# Patient Record
Sex: Female | Born: 1970 | Race: White | Hispanic: No | Marital: Married | Smoking: Never smoker
Health system: Southern US, Community
[De-identification: ages and names within clinical notes are randomized; demographics above are authoritative.]

## PROBLEM LIST (undated history)

## (undated) DIAGNOSIS — J45909 Unspecified asthma, uncomplicated: Secondary | ICD-10-CM

## (undated) DIAGNOSIS — E785 Hyperlipidemia, unspecified: Secondary | ICD-10-CM

## (undated) DIAGNOSIS — F32A Depression, unspecified: Secondary | ICD-10-CM

## (undated) DIAGNOSIS — G4733 Obstructive sleep apnea (adult) (pediatric): Secondary | ICD-10-CM

## (undated) DIAGNOSIS — R011 Cardiac murmur, unspecified: Secondary | ICD-10-CM

## (undated) DIAGNOSIS — F419 Anxiety disorder, unspecified: Secondary | ICD-10-CM

## (undated) DIAGNOSIS — I10 Essential (primary) hypertension: Secondary | ICD-10-CM

## (undated) HISTORY — DX: Hyperlipidemia, unspecified: E78.5

## (undated) HISTORY — DX: Obstructive sleep apnea (adult) (pediatric): G47.33

## (undated) HISTORY — PX: OTHER SURGICAL HISTORY: SHX169

## (undated) HISTORY — PX: TONSILLECTOMY: SUR1361

## (undated) HISTORY — PX: APPENDECTOMY: SHX54

## (undated) HISTORY — PX: CHOLECYSTECTOMY: SHX55

## (undated) HISTORY — PX: OVARIAN CYST REMOVAL: SHX89

---

## 2017-12-18 DIAGNOSIS — E559 Vitamin D deficiency, unspecified: Secondary | ICD-10-CM | POA: Insufficient documentation

## 2017-12-18 DIAGNOSIS — D239 Other benign neoplasm of skin, unspecified: Secondary | ICD-10-CM | POA: Insufficient documentation

## 2017-12-18 DIAGNOSIS — R739 Hyperglycemia, unspecified: Secondary | ICD-10-CM | POA: Insufficient documentation

## 2020-03-04 ENCOUNTER — Ambulatory Visit (INDEPENDENT_AMBULATORY_CARE_PROVIDER_SITE_OTHER): Payer: BLUE CROSS/BLUE SHIELD

## 2020-03-04 ENCOUNTER — Other Ambulatory Visit: Payer: Self-pay

## 2020-03-04 ENCOUNTER — Ambulatory Visit
Admission: EM | Admit: 2020-03-04 | Discharge: 2020-03-04 | Disposition: A | Discharge status: Home / Self Care | Payer: BLUE CROSS/BLUE SHIELD | Attending: Emergency Medicine | Admitting: Emergency Medicine

## 2020-03-04 ENCOUNTER — Encounter: Payer: Self-pay | Admitting: Emergency Medicine

## 2020-03-04 DIAGNOSIS — R059 Cough, unspecified: Secondary | ICD-10-CM

## 2020-03-04 DIAGNOSIS — J019 Acute sinusitis, unspecified: Secondary | ICD-10-CM

## 2020-03-04 HISTORY — DX: Cardiac murmur, unspecified: R01.1

## 2020-03-04 HISTORY — DX: Anxiety disorder, unspecified: F41.9

## 2020-03-04 HISTORY — DX: Depression, unspecified: F32.A

## 2020-03-04 HISTORY — DX: Unspecified asthma, uncomplicated: J45.909

## 2020-03-04 MED ORDER — AMOXICILLIN-POT CLAVULANATE 875-125 MG PO TABS: 1.0000 | ORAL_TABLET | Freq: Two times a day (BID) | ORAL | 0 refills | Status: AC

## 2020-03-04 MED ORDER — BENZONATATE 100 MG PO CAPS: 100.0000 mg | ORAL_CAPSULE | Freq: Three times a day (TID) | ORAL | 0 refills | Status: DC

## 2020-03-04 NOTE — Discharge Instructions (Signed)
Chest x-ray negative for cardiopulmonary disease Get plenty of rest and push fluids Tessalon Perles prescribed for cough Augmentin for possible sinus infection Use OTC zyrtec for nasal congestion, runny nose, and/or sore throat Use OTC flonase for nasal congestion and runny nose Use medications daily for symptom relief Use OTC medications like ibuprofen or tylenol as needed fever or pain Call or go to the ED if you have any new or worsening symptoms such as fever, worsening cough, shortness of breath, chest tightness, chest pain, turning blue, changes in mental status, etc..Marland Kitchen

## 2020-03-04 NOTE — ED Triage Notes (Signed)
Cough x 2 weeks, has had several neg covid test in the last 2 weeks.

## 2020-03-04 NOTE — ED Provider Notes (Signed)
[facility]   [id number] 03/04/20 Arrival Time: 1129   CC: Cough  SUBJECTIVE: History from: patient.  Darlene Christian is a 49 y.o. female who presents with mild sinus pain/ pressure, and dry/ productive cough x 2 weeks.  Treats patients with atypical PNA.  Has tried OTC cough drops with minimal relief.  Symptoms are made worse with deep breath.  Reports previous symptoms in the past with PNA.  Had COVID in September 2020.   Denies fever, chills, SOB, wheezing, chest pain, nausea, changes in bowel or bladder habits.    ROS: As per HPI.  All other pertinent ROS negative.     Past Medical History:  Diagnosis Date  . Anxiety   . Asthma   . Depression   . Heart murmur    Past Surgical History:  Procedure Laterality Date  . APPENDECTOMY    . CHOLECYSTECTOMY    . OVARIAN CYST REMOVAL    . TONSILLECTOMY     Allergies  Allergen Reactions  . Azithromycin Hives  . Capsaicin   . Lasix [Furosemide] Hives  . Sulfa Antibiotics Hives   No current facility-administered medications on file prior to encounter.   No current outpatient medications on file prior to encounter.   Social History   Socioeconomic History  . Marital status: Married    Spouse name: Not on file  . Number of children: Not on file  . Years of education: Not on file  . Highest education level: Not on file  Occupational History  . Not on file  Tobacco Use  . Smoking status: Never Smoker  . Smokeless tobacco: Never Used  Substance and Sexual Activity  . Alcohol use: Yes    Comment: occ  . Drug use: Never  . Sexual activity: Not on file  Other Topics Concern  . Not on file  Social History Narrative  . Not on file   Social Determinants of Health   Financial Resource Strain:   . Difficulty of Paying Living Expenses: Not on file  Food Insecurity:   . Worried About Charity fundraiser in the Last Year: Not on file  . Ran Out of Food in the Last Year: Not on file  Transportation Needs:   .  Lack of Transportation (Medical): Not on file  . Lack of Transportation (Non-Medical): Not on file  Physical Activity:   . Days of Exercise per Week: Not on file  . Minutes of Exercise per Session: Not on file  Stress:   . Feeling of Stress : Not on file  Social Connections:   . Frequency of Communication with Friends and Family: Not on file  . Frequency of Social Gatherings with Friends and Family: Not on file  . Attends Religious Services: Not on file  . Active Member of Clubs or Organizations: Not on file  . Attends Archivist Meetings: Not on file  . Marital Status: Not on file  Intimate Partner Violence:   . Fear of Current or Ex-Partner: Not on file  . Emotionally Abused: Not on file  . Physically Abused: Not on file  . Sexually Abused: Not on file   No family history on file.  OBJECTIVE:  Vitals:   03/04/20 1145 03/04/20 1147  BP:  (!) 162/86  Pulse: 87   Resp: 19   Temp: 98.7 F (37.1 C)   TempSrc: Oral   SpO2: 96%   Weight:  248 lb (112.5 kg)  Height:  5\' 3"  (1.6 m)  General appearance: alert; appears mildly fatigued, but nontoxic; speaking in full sentences and tolerating own secretions HEENT: NCAT; Ears: EACs clear, TMs pearly gray; Eyes: PERRL.  EOM grossly intact. Sinuses: TTP; Nose: nares patent without rhinorrhea, Throat: oropharynx clear, tonsils non erythematous or enlarged, uvula midline  Neck: supple without LAD Lungs: unlabored respirations, symmetrical air entry; cough: mild; no respiratory distress; CTAB Heart: regular rate and rhythm.  Skin: warm and dry Psychological: alert and cooperative; normal mood and affect  DIAGNOSTIC STUDIES:  DG Chest 2 View  Result Date: 03/04/2020 CLINICAL DATA:  Cough. EXAM: CHEST - 2 VIEW COMPARISON:  None. FINDINGS: The heart size and mediastinal contours are within normal limits. Both lungs are clear. The visualized skeletal structures are unremarkable. IMPRESSION: No active cardiopulmonary disease.  Electronically Signed   By: Marijo Conception M.D.   On: 03/04/2020 12:22    X-rays negative for cardiopulmonary disease  I have reviewed the x-rays myself and the radiologist interpretation. I am in agreement with the radiologist interpretation.     ASSESSMENT & PLAN:  1. Cough   2. Acute non-recurrent sinusitis, unspecified location     Meds ordered this encounter  Medications  . amoxicillin-clavulanate (AUGMENTIN) 875-125 MG tablet    Sig: Take 1 tablet by mouth every 12 (twelve) hours for 10 days.    Dispense:  20 tablet    Refill:  0    Order Specific Question:   Supervising Provider    Answer:   Raylene Everts [[id number]]  . benzonatate (TESSALON) 100 MG capsule    Sig: Take 1 capsule (100 mg total) by mouth every 8 (eight) hours.    Dispense:  21 capsule    Refill:  0    Order Specific Question:   Supervising Provider    Answer:   Raylene Everts [[id number]]   Chest x-ray negative for cardiopulmonary disease Get plenty of rest and push fluids Tessalon Perles prescribed for cough Augmentin for possible sinus infection Use OTC zyrtec for nasal congestion, runny nose, and/or sore throat Use OTC flonase for nasal congestion and runny nose Use medications daily for symptom relief Use OTC medications like ibuprofen or tylenol as needed fever or pain Call or go to the ED if you have any new or worsening symptoms such as fever, worsening cough, shortness of breath, chest tightness, chest pain, turning blue, changes in mental status, etc...   Reviewed expectations re: course of current medical issues. Questions answered. Outlined signs and symptoms indicating need for more acute intervention. Patient verbalized understanding. After Visit Summary given.         Lestine Box, PA-C 03/04/20 1232

## 2021-08-14 IMAGING — DX DG CHEST 2V
2 series · 2 of 2 positions shown · non-contrast
Comparison: None.

CLINICAL DATA: Cough.

EXAM:
CHEST - 2 VIEW

[chest pa]
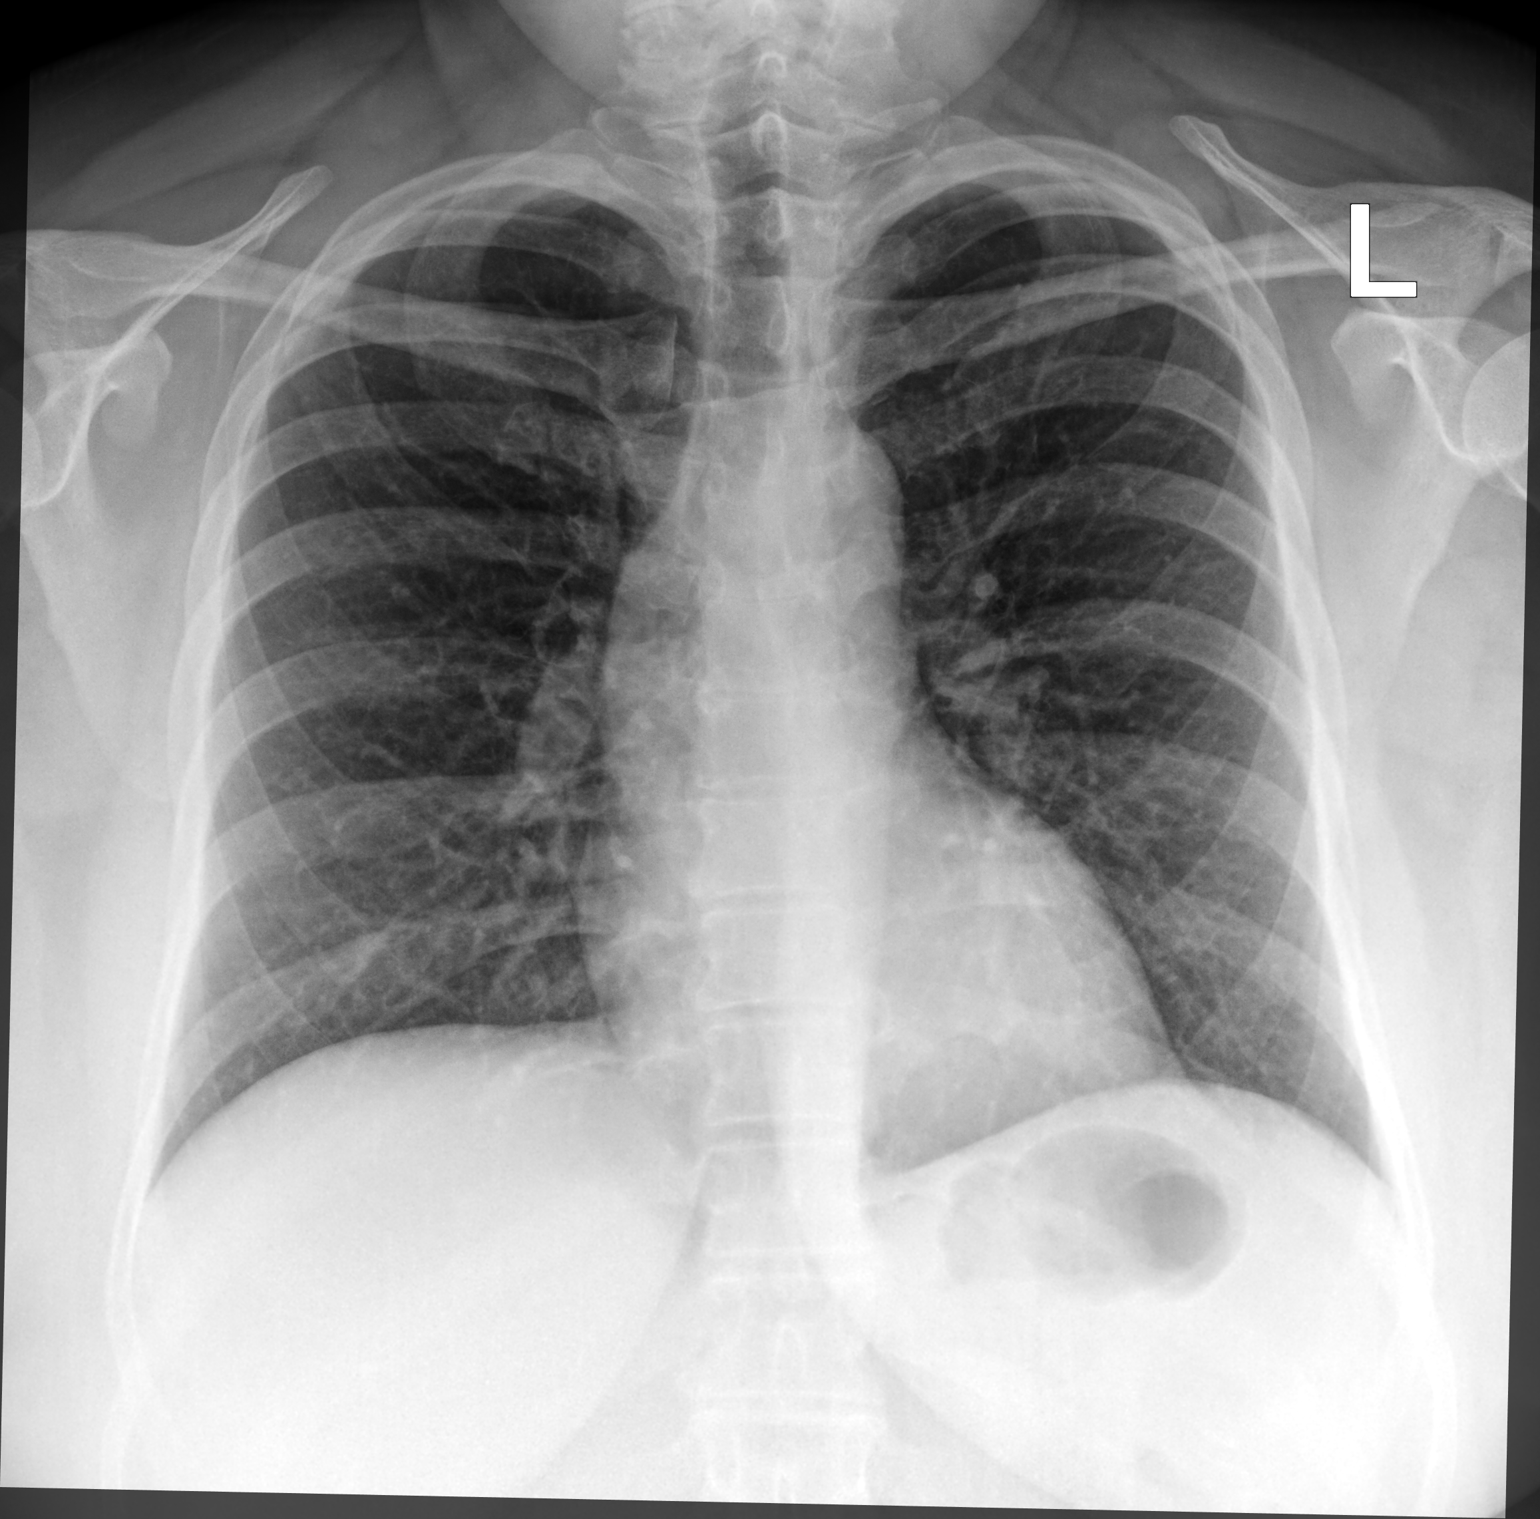

[chest lat]
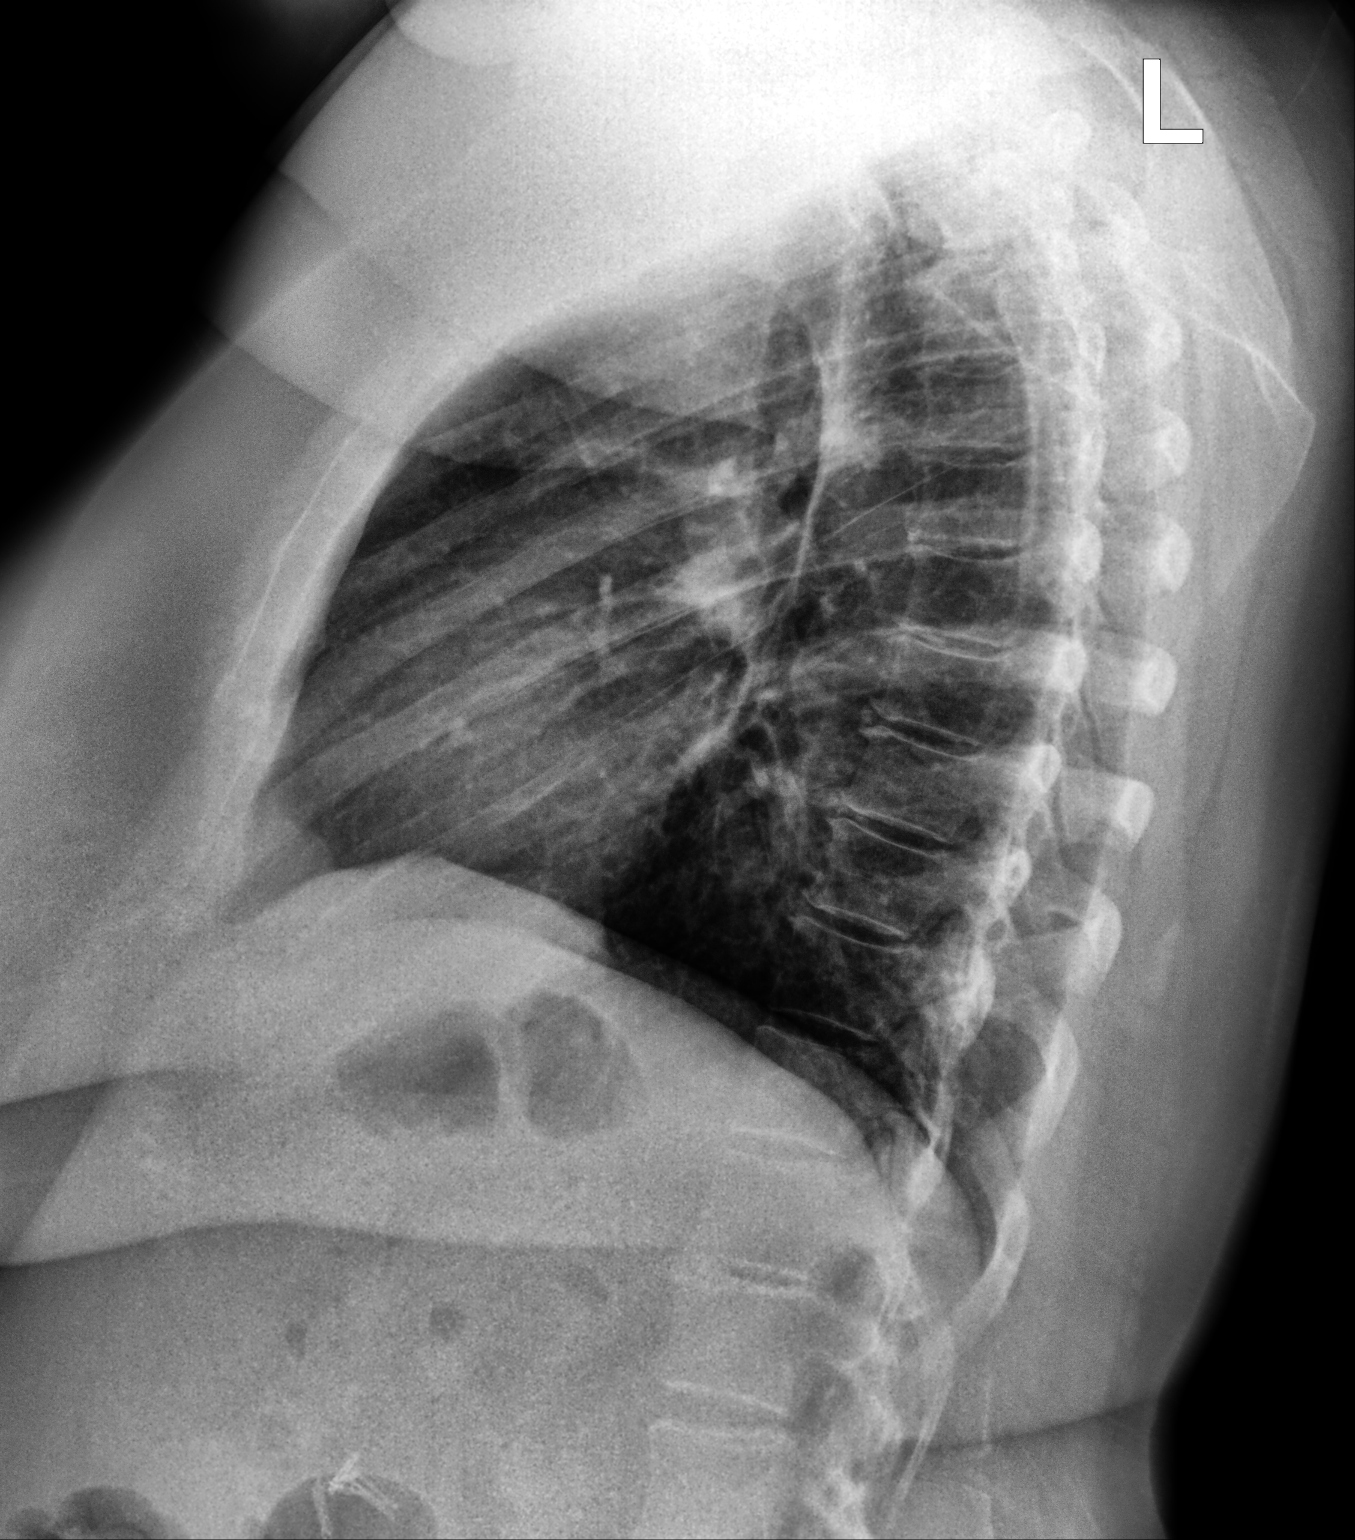

[2 of 2 positions shown; findings below may reference images not displayed]

FINDINGS: The heart size and mediastinal contours are within normal limits.
Both lungs are clear. The visualized skeletal structures are
unremarkable.
IMPRESSION: No active cardiopulmonary disease.

## 2021-11-03 ENCOUNTER — Ambulatory Visit
Admission: EM | Admit: 2021-11-03 | Discharge: 2021-11-03 | Disposition: A | Discharge status: Home / Self Care | Payer: BLUE CROSS/BLUE SHIELD

## 2021-11-03 ENCOUNTER — Encounter: Payer: Self-pay | Admitting: Emergency Medicine

## 2021-11-03 DIAGNOSIS — J209 Acute bronchitis, unspecified: Secondary | ICD-10-CM | POA: Diagnosis not present

## 2021-11-03 DIAGNOSIS — L03012 Cellulitis of left finger: Secondary | ICD-10-CM

## 2021-11-03 DIAGNOSIS — J4521 Mild intermittent asthma with (acute) exacerbation: Secondary | ICD-10-CM

## 2021-11-03 DIAGNOSIS — R030 Elevated blood-pressure reading, without diagnosis of hypertension: Secondary | ICD-10-CM | POA: Diagnosis not present

## 2021-11-03 HISTORY — DX: Essential (primary) hypertension: I10

## 2021-11-03 MED ORDER — PREDNISONE 20 MG PO TABS: 40.0000 mg | ORAL_TABLET | Freq: Every day | ORAL | 0 refills | Status: DC

## 2021-11-03 MED ORDER — ALBUTEROL SULFATE HFA 108 (90 BASE) MCG/ACT IN AERS: 1.0000 | INHALATION_SPRAY | Freq: Four times a day (QID) | RESPIRATORY_TRACT | 0 refills | Status: DC | PRN

## 2021-11-03 MED ORDER — CEPHALEXIN 500 MG PO CAPS: 500.0000 mg | ORAL_CAPSULE | Freq: Two times a day (BID) | ORAL | 0 refills | Status: DC

## 2021-11-03 MED ORDER — HYDROCHLOROTHIAZIDE 25 MG PO TABS: 25.0000 mg | ORAL_TABLET | Freq: Every day | ORAL | 1 refills | Status: DC

## 2021-11-03 NOTE — ED Provider Notes (Signed)
[facility]    CSN: [id number] Arrival date & time: 11/03/21  1131      History   Chief Complaint No chief complaint on file.   HPI Darlene Christian is a 51 y.o. female.   Presenting today with multiple concerns.  States that she has had cough, chest tightness, wheezing, mild shortness of breath for the past 2.5 weeks.  Denies fever, chills, congestion, sore throat.  Took a COVID test toward onset of symptoms that was negative.  History of asthma and states she has had off-and-on flareups such as this since having COVID in the past.  Ran out of her albuterol inhaler recently.  Recently had a hangnail on the left ring finger and notes some erythema, pain in the area the past few days that is worsening.  She is also out of her hydrochlorothiazide that she takes for her lower leg edema and notes her blood pressure has been elevated since being off of it.  Has been eating a lot of fast food and working a high stress job, thinks this is contributing.  Denies chest pain, shortness of breath, headaches, dizziness.    Past Medical History:  Diagnosis Date   Anxiety    Asthma    Depression    Heart murmur    Hypertension     There are no problems to display for this patient.   Past Surgical History:  Procedure Laterality Date   APPENDECTOMY     CHOLECYSTECTOMY     OVARIAN CYST REMOVAL     TONSILLECTOMY      OB History   No obstetric history on file.      Home Medications    Prior to Admission medications   Medication Sig Start Date End Date Taking? Authorizing Provider  albuterol (VENTOLIN HFA) 108 (90 Base) MCG/ACT inhaler Inhale 1-2 puffs into the lungs every 6 (six) hours as needed for wheezing or shortness of breath. 11/03/21  Yes Volney American, PA-C  cephALEXin (KEFLEX) 500 MG capsule Take 1 capsule (500 mg total) by mouth 2 (two) times daily. 11/03/21  Yes Volney American, PA-C  predniSONE (DELTASONE) 20 MG tablet Take 2 tablets (40 mg total)  by mouth daily with breakfast. 11/03/21  Yes Volney American, PA-C  benzonatate (TESSALON) 100 MG capsule Take 1 capsule (100 mg total) by mouth every 8 (eight) hours. 03/04/20   [place], [place], PA-C  hydrochlorothiazide (HYDRODIURIL) 25 MG tablet Take 1 tablet (25 mg total) by mouth daily. 11/03/21   Volney American, PA-C    Family History History reviewed. No pertinent family history.  Social History Social History   Tobacco Use   Smoking status: Never   Smokeless tobacco: Never  Substance Use Topics   Alcohol use: Yes    Comment: occ   Drug use: Never   Allergies   Azithromycin, Capsaicin, Lasix [furosemide], and Sulfa antibiotics  Review of Systems Review of Systems Per HPI  Physical Exam Triage Vital Signs ED Triage Vitals  Enc Vitals Group     BP 11/03/21 1140 (!) 172/110     Pulse Rate 11/03/21 1140 78     Resp 11/03/21 1140 18     Temp 11/03/21 1140 98.2 F (36.8 C)     Temp Source 11/03/21 1140 Oral     SpO2 11/03/21 1140 98 %     Weight --      Height --      Head Circumference --      Peak  Flow --      Pain Score 11/03/21 1141 0     Pain Loc --      Pain Edu? --      Excl. in [place]? --    No data found.  Updated Vital Signs BP (!) 172/110 (BP Location: Right Arm)   Pulse 78   Temp 98.2 F (36.8 C) (Oral)   Resp 18   LMP 10/10/2021 (Approximate)   SpO2 98%   Visual Acuity Right Eye Distance:   Left Eye Distance:   Bilateral Distance:    Right Eye Near:   Left Eye Near:    Bilateral Near:     Physical Exam Vitals and nursing note reviewed.  Constitutional:      Appearance: Normal appearance. She is not ill-appearing.  HENT:     Head: Atraumatic.     Mouth/Throat:     Mouth: Mucous membranes are moist.  Eyes:     Extraocular Movements: Extraocular movements intact.     Conjunctiva/sclera: Conjunctivae normal.  Cardiovascular:     Rate and Rhythm: Normal rate and regular rhythm.     Heart sounds: Normal heart sounds.   Pulmonary:     Effort: Pulmonary effort is normal.     Breath sounds: Wheezing present.     Comments: Trace wheezes bilaterally Musculoskeletal:        General: Normal range of motion.     Cervical back: Normal range of motion and neck supple.  Skin:    General: Skin is warm and dry.     Findings: Erythema present.     Comments: Medial edge of the left ring finger nail erythematous, mildly edematous.  No abscess formation  Neurological:     Mental Status: She is alert and oriented to person, place, and time.     Motor: No weakness.     Gait: Gait normal.     Comments: Left hand neurovascularly intact  Psychiatric:        Mood and Affect: Mood normal.        Thought Content: Thought content normal.        Judgment: Judgment normal.      UC Treatments / Results  Labs (all labs ordered are listed, but only abnormal results are displayed) Labs Reviewed - No data to display  EKG   Radiology No results found.  Procedures Procedures (including critical care time)  Medications Ordered in UC Medications - No data to display  Initial Impression / Assessment and Plan / UC Course  I have reviewed the triage vital signs and the nursing notes.  Pertinent labs & imaging results that were available during my care of the patient were reviewed by me and considered in my medical decision making (see chart for details).     Elevated blood pressure reading today, otherwise vital signs reassuring.  Will restart hydrochlorothiazide and monitor for benefit.  Discussed exercise, DASH diet as well.  Close PCP follow-up for recheck.  Will start Keflex for early cellulitis of the left finger, discussed home wound care.  Prednisone, albuterol inhaler for asthma exacerbation/bronchitis.  Return for worsening symptoms.  Final Clinical Impressions(s) / UC Diagnoses   Final diagnoses:  Acute bronchitis, unspecified organism  Mild intermittent asthma with acute exacerbation  Cellulitis of  finger of left hand  Elevated blood pressure reading   Discharge Instructions   None    ED Prescriptions     Medication Sig Dispense Auth. Provider   hydrochlorothiazide (HYDRODIURIL) 25 MG tablet Take  1 tablet (25 mg total) by mouth daily. 30 tablet Volney American, [place]   albuterol (VENTOLIN HFA) 108 (90 Base) MCG/ACT inhaler Inhale 1-2 puffs into the lungs every 6 (six) hours as needed for wheezing or shortness of breath. 18 g Volney American, [place]   predniSONE (DELTASONE) 20 MG tablet Take 2 tablets (40 mg total) by mouth daily with breakfast. 10 tablet Volney American, PA-C   cephALEXin (KEFLEX) 500 MG capsule Take 1 capsule (500 mg total) by mouth 2 (two) times daily. 14 capsule Volney American, [place]      PDMP not reviewed this encounter.   Volney American, [place] 11/03/21 1257

## 2021-11-03 NOTE — ED Triage Notes (Signed)
Cough, history of asthma feels SOB x 2.5 weeks.  Covid test was negative. States she thinks her left ring finger is infected around nail.  States she is out of her hydrochlorothiazide and her albuterol inhaler

## 2021-11-25 ENCOUNTER — Other Ambulatory Visit: Payer: Self-pay | Admitting: Family Medicine

## 2021-11-27 NOTE — Telephone Encounter (Signed)
prescribed by Merrie Roof PA at Urgent Care 11/03/21  Requested Prescriptions  Refused Prescriptions Disp Refills  . hydrochlorothiazide (HYDRODIURIL) 25 MG tablet [Pharmacy Med Name: HYDROCHLOROTHIAZIDE 25 MG TAB] 30 tablet 1    Sig: TAKE 1 TABLET (25 MG TOTAL) BY MOUTH DAILY.     There is no refill protocol information for this order

## 2022-03-11 ENCOUNTER — Other Ambulatory Visit: Payer: Self-pay | Admitting: Family Medicine

## 2022-03-12 NOTE — Telephone Encounter (Signed)
Urgent Care patient Requested Prescriptions  Pending Prescriptions Disp Refills   albuterol (VENTOLIN HFA) 108 (90 Base) MCG/ACT inhaler [Pharmacy Med Name: ALBUTEROL HFA (PROAIR) INHALER] 8.5 each     Sig: INHALE 1-2 PUFFS BY MOUTH EVERY 6 HOURS AS NEEDED FOR WHEEZE OR SHORTNESS OF BREATH     There is no refill protocol information for this order

## 2022-06-01 ENCOUNTER — Ambulatory Visit: Payer: BLUE CROSS/BLUE SHIELD | Admitting: Family Medicine

## 2022-06-01 ENCOUNTER — Encounter: Payer: Self-pay | Admitting: Family Medicine

## 2022-06-01 VITALS — BP 147/80 | HR 79 | Temp 97.9°F | Ht 63.0 in | Wt 251.2 lb

## 2022-06-01 DIAGNOSIS — R7302 Impaired glucose tolerance (oral): Secondary | ICD-10-CM

## 2022-06-01 DIAGNOSIS — Z7689 Persons encountering health services in other specified circumstances: Secondary | ICD-10-CM

## 2022-06-01 DIAGNOSIS — R2231 Localized swelling, mass and lump, right upper limb: Secondary | ICD-10-CM

## 2022-06-01 DIAGNOSIS — J4520 Mild intermittent asthma, uncomplicated: Secondary | ICD-10-CM | POA: Diagnosis not present

## 2022-06-01 DIAGNOSIS — R0683 Snoring: Secondary | ICD-10-CM | POA: Diagnosis not present

## 2022-06-01 DIAGNOSIS — I10 Essential (primary) hypertension: Secondary | ICD-10-CM | POA: Diagnosis not present

## 2022-06-01 DIAGNOSIS — N761 Subacute and chronic vaginitis: Secondary | ICD-10-CM

## 2022-06-01 LAB — POCT GLYCOSYLATED HEMOGLOBIN (HGB A1C): Hemoglobin A1C: 5.8 % — AB (ref 4.0–5.6)

## 2022-06-01 MED ORDER — POTASSIUM CHLORIDE CRYS ER 20 MEQ PO TBCR: 20.0000 meq | EXTENDED_RELEASE_TABLET | Freq: Every day | ORAL | 1 refills | Status: DC

## 2022-06-01 MED ORDER — NYSTATIN 100000 UNIT/GM EX OINT: 1.0000 | TOPICAL_OINTMENT | Freq: Two times a day (BID) | CUTANEOUS | 0 refills | Status: DC

## 2022-06-01 MED ORDER — COMBIVENT RESPIMAT 20-100 MCG/ACT IN AERS: 1.0000 | INHALATION_SPRAY | Freq: Four times a day (QID) | RESPIRATORY_TRACT | 5 refills | Status: AC | PRN

## 2022-06-01 MED ORDER — HYDROCHLOROTHIAZIDE 25 MG PO TABS: 25.0000 mg | ORAL_TABLET | Freq: Every day | ORAL | 1 refills | Status: DC

## 2022-06-01 NOTE — Progress Notes (Signed)
New Patient Office Visit  Subjective    Patient ID: Darlene Christian, female    DOB: 1970-09-01  Age: 52 y.o. MRN: [id number]  CC:  Chief Complaint  Patient presents with   New Patient (Initial Visit)    Patient in office - c/o recently elevated BP - requesting be checked for DM - feels different with eating sugar -     HPI Darlene Christian presents to establish care  Pt has hx of prediabetes. She would like her A1c checked today. She reports after she eats certain things, she felt like she had dizziness. She reports this has been going on for the last month.   She reports her blood pressure has been elevated. She went to urgent care in July 2023 for acute bronchitis. She says she had the HCTZ a few times a week for fluid. She says if she takes it daily, she has cramps.   Pt has hx of  HLD and was on Crestor. Ran out of this.  She has asthma and using Combivent and Flonase.   Pt reports her husband says she snores almost nightly. She would like sleep study.  She has vaginal itching. Worse at night. She has had to use a pad due to urinary leakage. She says she has used this in the last year. She also has had the itching in the last year.   She has periods of right finger nodules in the last week. She has used aleve prn for the pain and has helped. She was worried about RA.   Outpatient Encounter Medications as of 06/01/2022  Medication Sig   fluticasone (FLONASE) 50 MCG/ACT nasal spray Place 1 spray into both nostrils daily.   nystatin ointment (MYCOSTATIN) Apply 1 Application topically 2 (two) times daily.   potassium chloride SA (KLOR-CON M) 20 MEQ tablet Take 1 tablet (20 mEq total) by mouth daily.   [DISCONTINUED] hydrochlorothiazide (HYDRODIURIL) 25 MG tablet Take 1 tablet (25 mg total) by mouth daily.   [DISCONTINUED] Ipratropium-Albuterol (COMBIVENT RESPIMAT) 20-100 MCG/ACT AERS respimat Inhale 1 puff into the lungs every 6 (six) hours as needed.   [DISCONTINUED] rosuvastatin  (CRESTOR) 5 MG tablet Take 5 mg by mouth daily.   hydrochlorothiazide (HYDRODIURIL) 25 MG tablet Take 1 tablet (25 mg total) by mouth daily.   Ipratropium-Albuterol (COMBIVENT RESPIMAT) 20-100 MCG/ACT AERS respimat Inhale 1 puff into the lungs every 6 (six) hours as needed.   [DISCONTINUED] albuterol (VENTOLIN HFA) 108 (90 Base) MCG/ACT inhaler Inhale 1-2 puffs into the lungs every 6 (six) hours as needed for wheezing or shortness of breath.   [DISCONTINUED] benzonatate (TESSALON) 100 MG capsule Take 1 capsule (100 mg total) by mouth every 8 (eight) hours.   [DISCONTINUED] cephALEXin (KEFLEX) 500 MG capsule Take 1 capsule (500 mg total) by mouth 2 (two) times daily.   [DISCONTINUED] predniSONE (DELTASONE) 20 MG tablet Take 2 tablets (40 mg total) by mouth daily with breakfast.   No facility-administered encounter medications on file as of 06/01/2022.    Past Medical History:  Diagnosis Date   Anxiety    Asthma    Depression    Heart murmur    HLD (hyperlipidemia)    Hypertension     Past Surgical History:  Procedure Laterality Date   APPENDECTOMY     CHOLECYSTECTOMY     laproscopy     TONSILLECTOMY      Family History  Problem Relation Age of Onset   Hypertension Mother    Aneurysm Mother  brain   Hypertension Father    Breast cancer Maternal Grandmother    Uterine cancer Maternal Grandmother    Liver cancer Maternal Grandmother    Diabetes Maternal Grandmother    Throat cancer Maternal Grandfather    Aneurysm Maternal Grandfather    Hypertension Maternal Grandfather    Breast cancer Paternal Grandmother    Melanoma Paternal Grandmother    Hypertension Paternal Grandmother    Diabetes Paternal Grandfather    Colon cancer Paternal Grandfather     Social History   Socioeconomic History   Marital status: Married    Spouse name: jeffrey   Number of children: 2   Years of education: Not on file   Highest education level: Not on file  Occupational History    Occupation: Designer, 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  Tobacco Use   Smoking status: Never   Smokeless tobacco: Never  Vaping Use   Vaping Use: Never used  Substance and Sexual Activity   Alcohol use: Yes    Comment: occ   Drug use: Never   Sexual activity: Yes    Birth control/protection: None  Other Topics Concern   Not on file  Social History Narrative   Not on file   Social Determinants of Health   Financial Resource Strain: Not on file  Food Insecurity: Not on file  Transportation Needs: Not on file  Physical Activity: Not on file  Stress: Not on file  Social Connections: Not on file  Intimate Partner Violence: Not on file    Review of Systems  Musculoskeletal:  Positive for joint pain.  Skin:  Positive for itching.       Vaginal itching  Neurological:        Dizziness after eating  Psychiatric/Behavioral:  The patient has insomnia.        Snoring  All other systems reviewed and are negative.       Objective    BP (!) 147/80   Pulse 79   Temp 97.9 F (36.6 C)   Ht 5' 3"$  (1.6 m)   Wt 251 lb 4 oz (114 kg)   SpO2 99%   BMI 44.51 kg/m   Physical Exam Vitals and nursing note reviewed.  Constitutional:      Appearance: Normal appearance. She is obese.  HENT:     Head: Normocephalic and atraumatic.     Right Ear: Tympanic membrane, ear canal and external ear normal.     Left Ear: Tympanic membrane, ear canal and external ear normal.     Nose: Nose normal.     Mouth/Throat:     Mouth: Mucous membranes are moist.  Eyes:     Extraocular Movements: Extraocular movements intact.     Conjunctiva/sclera: Conjunctivae normal.     Pupils: Pupils are equal, round, and reactive to light.  Cardiovascular:     Rate and Rhythm: Normal rate and regular rhythm.     Pulses: Normal pulses.     Heart sounds: Normal heart sounds.  Pulmonary:     Breath sounds: Normal breath sounds.  Musculoskeletal:        General: Normal range of motion.     Comments: Nodules to right  1st and 2nd DIP joints  Skin:    General: Skin is warm.     Capillary Refill: Capillary refill takes less than 2 seconds.  Neurological:     General: No focal deficit present.     Mental Status: She is alert and oriented to person, place, and time. Mental  status is at baseline.  Psychiatric:        Mood and Affect: Mood normal.        Behavior: Behavior normal.        Thought Content: Thought content normal.        Judgment: Judgment normal.         Assessment & Plan:   Problem List Items Addressed This Visit   None Visit Diagnoses     Encounter to establish care with new doctor    -  Primary   Impaired glucose tolerance       Relevant Orders   POCT glycosylated hemoglobin (Hb A1C) (Completed)   Primary hypertension       Relevant Medications   hydrochlorothiazide (HYDRODIURIL) 25 MG tablet   potassium chloride SA (KLOR-CON M) 20 MEQ tablet   Mild intermittent asthma without complication       Relevant Medications   Ipratropium-Albuterol (COMBIVENT RESPIMAT) 20-100 MCG/ACT AERS respimat   Snoring       Relevant Orders   Ambulatory referral to Sleep Studies   Chronic vaginitis       Relevant Medications   nystatin ointment (MYCOSTATIN)   Nodule of finger of right hand          Lab Results  Component Value Date   HGBA1C 5.8 (A) 06/01/2022   Prediabetes much better. Continue to monitor. Diet and exercise Refill HCTZ 62m to take daily with KCL 20 meq. Monitor blood pressures outside the office and follow up for annual Refilled Combivent for asthma Refer for sleep study Nystatin ointment and try to change urinary pads.  Continue Aleve prn for joint pain. 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   Return in about 6 weeks (around 07/13/2022) for Annual Physical.   ALeeanne Rio MD

## 2022-06-24 ENCOUNTER — Encounter: Payer: Self-pay | Admitting: Family Medicine

## 2022-07-13 ENCOUNTER — Encounter: Payer: BLUE CROSS/BLUE SHIELD | Admitting: Family Medicine

## 2022-07-20 ENCOUNTER — Encounter: Payer: BLUE CROSS/BLUE SHIELD | Admitting: Family Medicine

## 2022-08-03 ENCOUNTER — Ambulatory Visit (INDEPENDENT_AMBULATORY_CARE_PROVIDER_SITE_OTHER): Payer: BLUE CROSS/BLUE SHIELD | Admitting: Family Medicine

## 2022-08-03 ENCOUNTER — Encounter: Payer: Self-pay | Admitting: Family Medicine

## 2022-08-03 VITALS — BP 124/83 | HR 75 | Temp 98.1°F | Resp 18 | Ht 63.0 in | Wt 250.2 lb

## 2022-08-03 DIAGNOSIS — Z0000 Encounter for general adult medical examination without abnormal findings: Secondary | ICD-10-CM

## 2022-08-03 DIAGNOSIS — F99 Mental disorder, not otherwise specified: Secondary | ICD-10-CM

## 2022-08-03 DIAGNOSIS — F5105 Insomnia due to other mental disorder: Secondary | ICD-10-CM

## 2022-08-03 DIAGNOSIS — R7302 Impaired glucose tolerance (oral): Secondary | ICD-10-CM | POA: Diagnosis not present

## 2022-08-03 DIAGNOSIS — Z1211 Encounter for screening for malignant neoplasm of colon: Secondary | ICD-10-CM

## 2022-08-03 DIAGNOSIS — R5382 Chronic fatigue, unspecified: Secondary | ICD-10-CM

## 2022-08-03 DIAGNOSIS — Z1231 Encounter for screening mammogram for malignant neoplasm of breast: Secondary | ICD-10-CM

## 2022-08-03 DIAGNOSIS — Z1159 Encounter for screening for other viral diseases: Secondary | ICD-10-CM

## 2022-08-03 DIAGNOSIS — Z13220 Encounter for screening for lipoid disorders: Secondary | ICD-10-CM

## 2022-08-03 DIAGNOSIS — Z6841 Body Mass Index (BMI) 40.0 and over, adult: Secondary | ICD-10-CM

## 2022-08-03 MED ORDER — ESCITALOPRAM OXALATE 10 MG PO TABS: 10.0000 mg | ORAL_TABLET | Freq: Every day | ORAL | 1 refills | Status: DC

## 2022-08-03 NOTE — Progress Notes (Signed)
Complete physical exam  Patient: Darlene Christian   DOB: 04-04-1971   51 y.o. Female  MRN: [id number]  Subjective:    Chief Complaint  Patient presents with   Annual Exam    Patient would like to talk about weight loss    Darlene Christian is a 52 y.o. female who presents today for a complete physical exam. She reports consuming a general diet. The patient does not participate in regular exercise at present. She generally feels well. She reports sleeping fairly well. She does not have additional problems to discuss today.  Fasting today. Pt is interested on weight loss medicine. Last A1c 5.8 in Feb 2024.  Most recent fall risk assessment:    06/01/2022   10:40 AM  Fall Risk   Falls in the past year? 1  Number falls in past yr: 1  Injury with Fall? 0  Risk for fall due to : History of fall(s)  Follow up Falls evaluation completed     Most recent depression screenings:    06/01/2022   10:40 AM  PHQ 2/9 Scores  PHQ - 2 Score 2  PHQ- 9 Score 13    Vision:Within last year  Patient Active Problem List   Diagnosis Date Noted   Dysplastic nevus of skin 12/18/2017   Hyperglycemia 12/18/2017   Vitamin D deficiency 12/18/2017   Past Medical History:  Diagnosis Date   Anxiety    Asthma    Depression    Heart murmur    HLD (hyperlipidemia)    Hypertension    Past Surgical History:  Procedure Laterality Date   APPENDECTOMY     CHOLECYSTECTOMY     laproscopy     TONSILLECTOMY     Social History   Tobacco Use   Smoking status: Never   Smokeless tobacco: Never  Vaping Use   Vaping Use: Never used  Substance Use Topics   Alcohol use: Yes    Comment: occ   Drug use: Never      Patient Care Team: Suzan Slick, MD as PCP - General (Family Medicine)   Outpatient Medications Prior to Visit  Medication Sig   fluticasone (FLONASE) 50 MCG/ACT nasal spray Place 1 spray into both nostrils daily.   hydrochlorothiazide (HYDRODIURIL) 25 MG tablet Take 1 tablet (25 mg  total) by mouth daily.   Ipratropium-Albuterol (COMBIVENT RESPIMAT) 20-100 MCG/ACT AERS respimat Inhale 1 puff into the lungs every 6 (six) hours as needed.   potassium chloride SA (KLOR-CON M) 20 MEQ tablet Take 1 tablet (20 mEq total) by mouth daily.   nystatin ointment (MYCOSTATIN) Apply 1 Application topically 2 (two) times daily. (Patient not taking: Reported on 08/03/2022)   No facility-administered medications prior to visit.    Review of Systems  Constitutional:  Positive for malaise/fatigue.  Cardiovascular:  Negative for leg swelling and PND.  Psychiatric/Behavioral:  The patient is nervous/anxious.   All other systems reviewed and are negative.         Objective:     BP 124/83   Pulse 75   Temp 98.1 F (36.7 C) (Oral)   Resp 18   Ht 5\' 3"  (1.6 m)   Wt 250 lb 3.2 oz (113.5 kg)   SpO2 97%   BMI 44.32 kg/m  BP Readings from Last 3 Encounters:  08/03/22 124/83  06/01/22 (!) 147/80  11/03/21 (!) 172/110      Physical Exam Vitals and nursing note reviewed.  Constitutional:      Appearance: Normal appearance.  She is obese.  HENT:     Head: Normocephalic and atraumatic.     Right Ear: External ear normal.     Left Ear: External ear normal.     Nose: Nose normal.     Mouth/Throat:     Mouth: Mucous membranes are moist.     Pharynx: Oropharynx is clear.  Eyes:     Conjunctiva/sclera: Conjunctivae normal.     Pupils: Pupils are equal, round, and reactive to light.  Cardiovascular:     Rate and Rhythm: Normal rate and regular rhythm.     Pulses: Normal pulses.     Heart sounds: Normal heart sounds.  Pulmonary:     Effort: Pulmonary effort is normal.     Breath sounds: Normal breath sounds.  Abdominal:     General: Abdomen is flat.  Skin:    General: Skin is warm.     Capillary Refill: Capillary refill takes less than 2 seconds.  Neurological:     General: No focal deficit present.     Mental Status: She is alert and oriented to person, place, and  time. Mental status is at baseline.  Psychiatric:        Mood and Affect: Mood normal.        Behavior: Behavior normal.        Thought Content: Thought content normal.        Judgment: Judgment normal.      No results found for any visits on 08/03/22.      Assessment & Plan:    Routine Health Maintenance and Physical Exam   There is no immunization history on file for this patient.  Health Maintenance  Topic Date Due   COVID-19 Vaccine (1) Never done   HIV Screening  Never done   Hepatitis C Screening  Never done   DTaP/Tdap/Td (1 - Tdap) Never done   COLONOSCOPY (Pts 45-73yrs Insurance coverage will need to be confirmed)  Never done   MAMMOGRAM  Never done   Zoster Vaccines- Shingrix (1 of 2) 08/30/2022 (Originally 04/22/1988)   INFLUENZA VACCINE  11/22/2022   PAP SMEAR-Modifier  06/15/2023   HPV VACCINES  Aged Out    Discussed health benefits of physical activity, and encouraged her to engage in regular exercise appropriate for her age and condition.  Problem List Items Addressed This Visit   None Visit Diagnoses     Annual physical exam    -  Primary   Encounter for lipid screening for cardiovascular disease       Relevant Orders   Lipid panel   Impaired glucose tolerance       Relevant Orders   CBC with Differential/Platelet   Comprehensive metabolic panel   Screening for colon cancer       Relevant Orders   Ambulatory referral to Gastroenterology   Screening mammogram for breast cancer       Relevant Orders   MM 3D SCREENING MAMMOGRAM BILATERAL BREAST   Need for hepatitis C screening test       Relevant Orders   Hepatitis C antibody   Screening for viral disease       Relevant Orders   HIV Antibody (routine testing w rflx)   Chronic fatigue       Relevant Orders   T4, free   TSH   Insomnia due to other mental disorder       Relevant Medications   escitalopram (LEXAPRO) 10 MG tablet   BMI 40.0-44.9, adult  Return in about 3 months  (around 11/02/2022) for Chronic condition follow up. Screening labs including thyroid Last A1c 5.8. rule out thyroid. If normal start GLP-1 Will send in Lexapro since it helped with sleep and anxiety at night. She also has sleep study next month. Refer for colonoscopy and mammogram.     Suzan Slick, MD

## 2022-08-04 LAB — CBC WITH DIFFERENTIAL/PLATELET
Basophils Absolute: 0 10*3/uL (ref 0.0–0.2)
Basos: 0 %
EOS (ABSOLUTE): 0.1 10*3/uL (ref 0.0–0.4)
Eos: 1 %
Hematocrit: 38.5 % (ref 34.0–46.6)
Hemoglobin: 13 g/dL (ref 11.1–15.9)
Immature Grans (Abs): 0 10*3/uL (ref 0.0–0.1)
Immature Granulocytes: 0 %
Lymphocytes Absolute: 1.9 10*3/uL (ref 0.7–3.1)
Lymphs: 24 %
MCH: 30.9 pg (ref 26.6–33.0)
MCHC: 33.8 g/dL (ref 31.5–35.7)
MCV: 91 fL (ref 79–97)
Monocytes Absolute: 0.5 10*3/uL (ref 0.1–0.9)
Monocytes: 6 %
Neutrophils Absolute: 5.4 10*3/uL (ref 1.4–7.0)
Neutrophils: 69 %
Platelets: 411 10*3/uL (ref 150–450)
RBC: 4.21 x10E6/uL (ref 3.77–5.28)
RDW: 12.5 % (ref 11.7–15.4)
WBC: 8 10*3/uL (ref 3.4–10.8)

## 2022-08-04 LAB — COMPREHENSIVE METABOLIC PANEL
ALT: 42 IU/L — ABNORMAL HIGH (ref 0–32)
AST: 27 IU/L (ref 0–40)
Albumin/Globulin Ratio: 1.6 (ref 1.2–2.2)
Albumin: 4.1 g/dL (ref 3.8–4.9)
Alkaline Phosphatase: 72 IU/L (ref 44–121)
BUN/Creatinine Ratio: 15 (ref 9–23)
BUN: 10 mg/dL (ref 6–24)
Bilirubin Total: 0.3 mg/dL (ref 0.0–1.2)
CO2: 23 mmol/L (ref 20–29)
Calcium: 9.3 mg/dL (ref 8.7–10.2)
Chloride: 100 mmol/L (ref 96–106)
Creatinine, Ser: 0.68 mg/dL (ref 0.57–1.00)
Globulin, Total: 2.6 g/dL (ref 1.5–4.5)
Glucose: 104 mg/dL — ABNORMAL HIGH (ref 70–99)
Potassium: 4.4 mmol/L (ref 3.5–5.2)
Sodium: 141 mmol/L (ref 134–144)
Total Protein: 6.7 g/dL (ref 6.0–8.5)
eGFR: 104 mL/min/{1.73_m2} (ref >59)

## 2022-08-04 LAB — LIPID PANEL
Chol/HDL Ratio: 5.1 ratio — ABNORMAL HIGH (ref 0.0–4.4)
Cholesterol, Total: 251 mg/dL — ABNORMAL HIGH (ref 100–199)
HDL: 49 mg/dL (ref >39)
LDL Chol Calc (NIH): 164 mg/dL — ABNORMAL HIGH (ref 0–99)
Triglycerides: 208 mg/dL — ABNORMAL HIGH (ref 0–149)
VLDL Cholesterol Cal: 38 mg/dL (ref 5–40)

## 2022-08-04 LAB — T4, FREE: Free T4: 0.97 ng/dL (ref 0.82–1.77)

## 2022-08-04 LAB — HIV ANTIBODY (ROUTINE TESTING W REFLEX): HIV Screen 4th Generation wRfx: NONREACTIVE

## 2022-08-04 LAB — TSH: TSH: 1.86 u[IU]/mL (ref 0.450–4.500)

## 2022-08-04 LAB — HEPATITIS C ANTIBODY: Hep C Virus Ab: NONREACTIVE

## 2022-08-06 ENCOUNTER — Other Ambulatory Visit: Payer: Self-pay | Admitting: Family Medicine

## 2022-08-06 ENCOUNTER — Encounter: Payer: Self-pay | Admitting: Family Medicine

## 2022-08-06 DIAGNOSIS — E782 Mixed hyperlipidemia: Secondary | ICD-10-CM

## 2022-08-06 DIAGNOSIS — R7303 Prediabetes: Secondary | ICD-10-CM

## 2022-08-07 MED ORDER — ATORVASTATIN CALCIUM 10 MG PO TABS: 10.0000 mg | ORAL_TABLET | Freq: Every evening | ORAL | 3 refills | Status: DC

## 2022-08-07 MED ORDER — METFORMIN HCL ER 500 MG PO TB24: 500.0000 mg | ORAL_TABLET | Freq: Every day | ORAL | 1 refills | Status: DC

## 2022-08-07 NOTE — Addendum Note (Signed)
Addended by: Suzan Slick on: 08/07/2022 04:38 PM   Modules accepted: Orders

## 2022-09-07 ENCOUNTER — Encounter: Payer: Self-pay | Admitting: Pulmonary Disease

## 2022-09-07 ENCOUNTER — Ambulatory Visit (INDEPENDENT_AMBULATORY_CARE_PROVIDER_SITE_OTHER): Payer: BLUE CROSS/BLUE SHIELD | Admitting: Pulmonary Disease

## 2022-09-07 VITALS — BP 136/65 | HR 60 | Ht 63.0 in | Wt 249.0 lb

## 2022-09-07 DIAGNOSIS — J45909 Unspecified asthma, uncomplicated: Secondary | ICD-10-CM | POA: Insufficient documentation

## 2022-09-07 DIAGNOSIS — J4520 Mild intermittent asthma, uncomplicated: Secondary | ICD-10-CM

## 2022-09-07 DIAGNOSIS — R0683 Snoring: Secondary | ICD-10-CM | POA: Diagnosis not present

## 2022-09-07 NOTE — Assessment & Plan Note (Signed)
Mild intermittent symptoms. She uses Combivent which is more effective than albuterol MDI. Does not seem to require inhaled steroid at present.  Last prednisone use was 5 months ago

## 2022-09-07 NOTE — Patient Instructions (Signed)
X Home sleep test 

## 2022-09-07 NOTE — Progress Notes (Signed)
Subjective:    Patient ID: Darlene Christian, female    DOB: 1970/08/07, 52 y.o.   MRN: [id number]  HPI  52 year old close petitioner presents for evaluation of sleep disordered breathing. Her husband uses a CPAP device.  He has noted loud snoring.  She reports excessive daytime fatigue and nonrefreshing sleep. Epworth sleepiness score is 11 and she reports some sleepiness while sitting and active or as a passenger in a car. Bedtime is around 10 PM, sleep latency about 30 minutes, she sleeps on her right side with 2 pillows, reports multiple nocturnal awakenings spontaneously and is out of bed at 6:30 AM feeling tired with dryness of mouth, denies headaches. Weight has been steady over the past few years. There is no history suggestive of cataplexy, sleep paralysis or parasomnias  She reports perennial nasal congestion.  PMH : Hypertension Prediabetes, PCOS Hyperlipidemia Mild intermittent asthma, allergic to cats, last prednisone use 2023 COVID-pneumonia 2020, required high flow oxygen   Significant tests/ events reviewed  07/2022 AEC 100   Past Medical History:  Diagnosis Date   Anxiety    Asthma    Depression    Heart murmur    HLD (hyperlipidemia)    Hypertension    Past Surgical History:  Procedure Laterality Date   APPENDECTOMY     CHOLECYSTECTOMY     laproscopy     TONSILLECTOMY     Allergies  Allergen Reactions   Cat Hair Extract Hives   Clarithromycin Hives   Sulfa Antibiotics Hives   Azithromycin Hives   Capsaicin    Lasix [Furosemide] Hives    Social History   Socioeconomic History   Marital status: Married    Spouse name: Stage manager   Number of children: 2   Years of education: Not on file   Highest education level: Not on file  Occupational History   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  Tobacco Use   Smoking status: Never   Smokeless tobacco: Never  Vaping Use   Vaping Use: Never used  Substance and Sexual Activity   Alcohol use: Yes     Comment: occ   Drug use: Never   Sexual activity: Yes    Birth control/protection: None  Other Topics Concern   Not on file  Social History Narrative   Not on file   Social Determinants of Health   Financial Resource Strain: Not on file  Food Insecurity: Not on file  Transportation Needs: Not on file  Physical Activity: Not on file  Stress: Not on file  Social Connections: Not on file  Intimate Partner Violence: Not on file    Family History  Problem Relation Age of Onset   Hypertension Mother    Aneurysm Mother        brain   Hypertension Father    Breast cancer Maternal Grandmother    Uterine cancer Maternal Grandmother    Liver cancer Maternal Grandmother    Diabetes Maternal Grandmother    Throat cancer Maternal Grandfather    Aneurysm Maternal Grandfather    Hypertension Maternal Grandfather    Breast cancer Paternal Grandmother    Melanoma Paternal Grandmother    Hypertension Paternal Grandmother    Diabetes Paternal Grandfather    Colon cancer Paternal Grandfather      Review of Systems  Constitutional: negative for anorexia, fevers and sweats  Eyes: negative for irritation, redness and visual disturbance  Ears, nose, mouth, throat, and face: negative for earaches, epistaxis, nasal congestion and sore throat  Respiratory: negative  for cough, dyspnea on exertion, sputum and wheezing  Cardiovascular: negative for chest pain, dyspnea, lower extremity edema, orthopnea, palpitations and syncope  Gastrointestinal: negative for abdominal pain, constipation, diarrhea, melena, nausea and vomiting  Genitourinary:negative for dysuria, frequency and hematuria  Hematologic/lymphatic: negative for bleeding, easy bruising and lymphadenopathy  Musculoskeletal:negative for arthralgias, muscle weakness and stiff joints  Neurological: negative for coordination problems, gait problems, headaches and weakness  Endocrine: negative for diabetic symptoms including polydipsia,  polyuria and weight loss     Objective:   Physical Exam  Gen. Pleasant, obese, in no distress, normal affect ENT - no pallor,icterus, no post nasal drip, class 2-3 airway Neck: No JVD, no thyromegaly, no carotid bruits Lungs: no use of accessory muscles, no dullness to percussion, decreased without rales or rhonchi  Cardiovascular: Rhythm regular, heart sounds  normal, no murmurs or gallops, no peripheral edema Abdomen: soft and non-tender, no hepatosplenomegaly, BS normal. Musculoskeletal: No deformities, no cyanosis or clubbing Neuro:  alert, non focal, no tremors       Assessment & Plan:    OSA -Given excessive daytime somnolence, narrow pharyngeal exam, nonrefreshing sleep & loud snoring, obstructive sleep apnea is very likely & an overnight polysomnogram will be scheduled as a home study. The pathophysiology of obstructive sleep apnea , it's cardiovascular consequences & modes of treatment including CPAP were discused with the patient in detail & they evidenced understanding.  Pretest probably is intermediate to high.  She would be willing to use a CPAP if needed

## 2022-09-26 ENCOUNTER — Ambulatory Visit: Payer: BLUE CROSS/BLUE SHIELD

## 2022-11-02 ENCOUNTER — Ambulatory Visit: Payer: BLUE CROSS/BLUE SHIELD | Admitting: Family Medicine

## 2022-11-09 ENCOUNTER — Telehealth: Payer: Self-pay | Admitting: Pulmonary Disease

## 2022-11-09 ENCOUNTER — Encounter (INDEPENDENT_AMBULATORY_CARE_PROVIDER_SITE_OTHER): Payer: BLUE CROSS/BLUE SHIELD

## 2022-11-09 DIAGNOSIS — G4733 Obstructive sleep apnea (adult) (pediatric): Secondary | ICD-10-CM

## 2022-11-09 DIAGNOSIS — R0683 Snoring: Secondary | ICD-10-CM

## 2022-11-09 NOTE — Telephone Encounter (Signed)
Spoke with patient and reviewed results and recommendations. Patient agrees to start CPAP therapy. Order placed. Patient aware to schedule OV 8 weeks after starting therapy and that DME will contact her to set up machine. Nothing further needed at this time.

## 2022-11-09 NOTE — Telephone Encounter (Signed)
HST showed mild  OSA with AHI 12/ hr Suggest autoCPAP  5-15 cm, mask of choice OV with me/APP in 8 wks after starting

## 2022-11-16 ENCOUNTER — Other Ambulatory Visit: Payer: Self-pay | Admitting: Family Medicine

## 2022-11-16 DIAGNOSIS — R7303 Prediabetes: Secondary | ICD-10-CM

## 2022-11-22 ENCOUNTER — Encounter: Payer: Self-pay | Admitting: Family Medicine

## 2022-11-22 ENCOUNTER — Ambulatory Visit: Payer: BLUE CROSS/BLUE SHIELD | Admitting: Family Medicine

## 2022-11-22 VITALS — BP 138/81 | HR 87 | Temp 98.1°F | Resp 18 | Ht 63.0 in | Wt 242.0 lb

## 2022-11-22 DIAGNOSIS — E782 Mixed hyperlipidemia: Secondary | ICD-10-CM

## 2022-11-22 DIAGNOSIS — I10 Essential (primary) hypertension: Secondary | ICD-10-CM | POA: Diagnosis not present

## 2022-11-22 DIAGNOSIS — F5105 Insomnia due to other mental disorder: Secondary | ICD-10-CM | POA: Diagnosis not present

## 2022-11-22 DIAGNOSIS — G4733 Obstructive sleep apnea (adult) (pediatric): Secondary | ICD-10-CM

## 2022-11-22 DIAGNOSIS — R5382 Chronic fatigue, unspecified: Secondary | ICD-10-CM | POA: Insufficient documentation

## 2022-11-22 DIAGNOSIS — F99 Mental disorder, not otherwise specified: Secondary | ICD-10-CM | POA: Insufficient documentation

## 2022-11-22 MED ORDER — POTASSIUM CHLORIDE CRYS ER 20 MEQ PO TBCR: 20.0000 meq | EXTENDED_RELEASE_TABLET | Freq: Every day | ORAL | 1 refills | Status: DC

## 2022-11-22 MED ORDER — HYDROCHLOROTHIAZIDE 25 MG PO TABS: 25.0000 mg | ORAL_TABLET | Freq: Every day | ORAL | 1 refills | Status: DC

## 2022-11-22 MED ORDER — ESCITALOPRAM OXALATE 10 MG PO TABS: 5.0000 mg | ORAL_TABLET | Freq: Every day | ORAL | 0 refills | Status: DC

## 2022-11-22 NOTE — Progress Notes (Signed)
Established Patient Office Visit  Subjective   Patient ID: Darlene Christian, female    DOB: 1970-09-01  Age: 52 y.o. MRN: [id number]  Chief Complaint  Patient presents with   Follow-up    HPI  Hyperlipidemia Pt was seen in April for physical and labs. Seen to have elevated cholesterol. Has been taking Atorvastatin 10mg  daily. Here for recheck of this. Tolerating medicine well.  Hypertension Taking hydrochlorothiazide 25mg  with KCL 20 meq daily. Needs refills of this. Blood pressure at goal today.  Chronic fatigue Pt was referred for sleep study. Had this done and revealed OSA. She was prescribed CPAP but yet to get this. She reports waking up tired and feels like it's from the Lexapro 10 mg she takes for insomnia and anxiety.   Patient Active Problem List   Diagnosis Date Noted   Asthma 09/07/2022   Dysplastic nevus of skin 12/18/2017   Hyperglycemia 12/18/2017   Vitamin D deficiency 12/18/2017    Review of Systems  Constitutional:  Positive for malaise/fatigue.  All other systems reviewed and are negative.    Objective:     BP 138/81   Pulse 87   Temp 98.1 F (36.7 C) (Oral)   Resp 18   Ht 5\' 3"  (1.6 m)   Wt 242 lb (109.8 kg)   LMP  (Approximate)   SpO2 99%   BMI 42.87 kg/m  BP Readings from Last 3 Encounters:  11/22/22 138/81  09/07/22 136/65  08/03/22 124/83      Physical Exam Vitals and nursing note reviewed.  Constitutional:      Appearance: Normal appearance. She is obese.  HENT:     Head: Normocephalic and atraumatic.     Right Ear: External ear normal.     Left Ear: External ear normal.     Nose: Nose normal.     Mouth/Throat:     Mouth: Mucous membranes are moist.  Eyes:     Pupils: Pupils are equal, round, and reactive to light.  Cardiovascular:     Rate and Rhythm: Normal rate and regular rhythm.     Pulses: Normal pulses.     Heart sounds: Normal heart sounds.  Pulmonary:     Effort: Pulmonary effort is normal.     Breath sounds:  Normal breath sounds.  Abdominal:     General: Abdomen is flat.  Skin:    Capillary Refill: Capillary refill takes less than 2 seconds.  Neurological:     General: No focal deficit present.     Mental Status: She is alert and oriented to person, place, and time. Mental status is at baseline.  Psychiatric:        Mood and Affect: Mood normal.        Behavior: Behavior normal.        Thought Content: Thought content normal.        Judgment: Judgment normal.    No results found for any visits on 11/22/22.  Last CBC Lab Results  Component Value Date   WBC 8.0 08/03/2022   HGB 13.0 08/03/2022   HCT 38.5 08/03/2022   MCV 91 08/03/2022   MCH 30.9 08/03/2022   RDW 12.5 08/03/2022   PLT 411 08/03/2022   Last metabolic panel Lab Results  Component Value Date   GLUCOSE 104 (H) 08/03/2022   NA 141 08/03/2022   K 4.4 08/03/2022   CL 100 08/03/2022   CO2 23 08/03/2022   BUN 10 08/03/2022   CREATININE 0.68 08/03/2022  EGFR 104 08/03/2022   CALCIUM 9.3 08/03/2022   PROT 6.7 08/03/2022   ALBUMIN 4.1 08/03/2022   LABGLOB 2.6 08/03/2022   AGRATIO 1.6 08/03/2022   BILITOT 0.3 08/03/2022   ALKPHOS 72 08/03/2022   AST 27 08/03/2022   ALT 42 (H) 08/03/2022   Last lipids Lab Results  Component Value Date   CHOL 251 (H) 08/03/2022   HDL 49 08/03/2022   LDLCALC 164 (H) 08/03/2022   TRIG 208 (H) 08/03/2022   CHOLHDL 5.1 (H) 08/03/2022   Last hemoglobin A1c Lab Results  Component Value Date   HGBA1C 5.8 (A) 06/01/2022      The 10-year ASCVD risk score (Arnett DK, et al., 2019) is: 3.8%    Assessment & Plan:   Problem List Items Addressed This Visit   None Mixed hyperlipidemia -     Lipid panel  Primary hypertension -     hydroCHLOROthiazide; Take 1 tablet (25 mg total) by mouth daily.  Dispense: 90 tablet; Refill: 1 -     Potassium Chloride Crys ER; Take 1 tablet (20 mEq total) by mouth daily.  Dispense: 90 tablet; Refill: 1  Insomnia due to other mental  disorder -     Escitalopram Oxalate; Take 0.5 tablets (5 mg total) by mouth daily.  Dispense: 22.5 tablet; Refill: 0  Chronic fatigue  OSA (obstructive sleep apnea)   Recheck lipid panel today. Adjust dose pending results. Refilled hydrochlorothiazide 25 mg and KCL 20 Meq daily for HTN. Blood pressure at goal. Due to fatigue worsened by Lexapro per pt, advised to go down to 1/2 tab of 10mg  at night. Also advised that she has OSA which is also causing fatigue. To obtain CPAP soon.   No follow-ups on file.    Suzan Slick, MD

## 2023-03-29 ENCOUNTER — Ambulatory Visit: Payer: BLUE CROSS/BLUE SHIELD | Admitting: Family Medicine

## 2023-04-01 ENCOUNTER — Ambulatory Visit: Payer: BLUE CROSS/BLUE SHIELD | Admitting: Family Medicine

## 2023-04-01 ENCOUNTER — Encounter: Payer: Self-pay | Admitting: Family Medicine

## 2023-04-01 ENCOUNTER — Other Ambulatory Visit: Payer: Self-pay | Admitting: Family Medicine

## 2023-04-01 VITALS — BP 131/79 | HR 80 | Temp 98.1°F | Resp 18 | Ht 63.0 in | Wt 237.9 lb

## 2023-04-01 DIAGNOSIS — Z23 Encounter for immunization: Secondary | ICD-10-CM | POA: Diagnosis not present

## 2023-04-01 DIAGNOSIS — R21 Rash and other nonspecific skin eruption: Secondary | ICD-10-CM

## 2023-04-01 DIAGNOSIS — Z124 Encounter for screening for malignant neoplasm of cervix: Secondary | ICD-10-CM | POA: Diagnosis not present

## 2023-04-01 DIAGNOSIS — Z6841 Body Mass Index (BMI) 40.0 and over, adult: Secondary | ICD-10-CM | POA: Diagnosis not present

## 2023-04-01 DIAGNOSIS — G4733 Obstructive sleep apnea (adult) (pediatric): Secondary | ICD-10-CM | POA: Diagnosis not present

## 2023-04-01 MED ORDER — METRONIDAZOLE 1 % EX GEL: Freq: Every day | CUTANEOUS | 0 refills | Status: DC

## 2023-04-01 NOTE — Progress Notes (Signed)
Established Patient Office Visit  Subjective   Patient ID: Darlene Christian, female    DOB: 02-03-1971  Age: 52 y.o. MRN: [id number]  Chief Complaint  Patient presents with   Medical Management of Chronic Issues    Patient is here for a 4 month follow up    HPI  Fatigue Pt reports she does have OSA and is yet to get CPAP. She has been working out of town recently and is unable to make the appts with the pulmonologist to follow up on this. She reports the order is at the home DME company. She just has to go to have the equipment set up and she is yet to do that.   Prediabetes/Weight Pt has prediabetes and was started on Metformin 500mg  daily. She says she isn't has hungry and has improved her polydipsia and polyuria. She has lost 5 lbs since last visit.   Pt would like flu vaccine today.  She needs referral to OB/GYN for pap smear. She reports a rash across her nose and cheeks for the last 3 days. She doesn't have new irritants or contact with any new substances. No pain or itching to rash.  Pt uses dial soap to wash her face. No new make up. She doesn't moisturize her face after washing.  Patient Active Problem List   Diagnosis Date Noted   OSA (obstructive sleep apnea) 11/22/2022   Chronic fatigue 11/22/2022   Insomnia due to other mental disorder 11/22/2022   Primary hypertension 11/22/2022   Mixed hyperlipidemia 11/22/2022   Asthma 09/07/2022   Dysplastic nevus of skin 12/18/2017   Hyperglycemia 12/18/2017   Vitamin D deficiency 12/18/2017   Past Medical History:  Diagnosis Date   Anxiety    Asthma    Depression    Heart murmur    HLD (hyperlipidemia)    Hypertension    OSA (obstructive sleep apnea)    HST showed mild  OSA with AHI 12/ hr Suggest autoCPAP  5-15 cm, mask of choice   Past Surgical History:  Procedure Laterality Date   APPENDECTOMY     CHOLECYSTECTOMY     laproscopy     TONSILLECTOMY     Social History   Tobacco Use   Smoking status: Never    Smokeless tobacco: Never  Vaping Use   Vaping status: Never Used  Substance Use Topics   Alcohol use: Yes    Comment: occ   Drug use: Never   Family Status  Relation Name Status   Mother  Alive   Father  Alive   MGM  Deceased   MGF  Deceased   PGM  Deceased   PGF  Deceased  No partnership data on file   Allergies  Allergen Reactions   Cat Hair Extract Hives   Clarithromycin Hives   Sulfa Antibiotics Hives   Azithromycin Hives   Capsaicin    Lasix [Furosemide] Hives      Review of Systems  Constitutional:  Positive for malaise/fatigue.  Skin:  Positive for rash.  All other systems reviewed and are negative.    Objective:     BP 131/79   Pulse 80   Temp 98.1 F (36.7 C) (Oral)   Resp 18   Ht 5\' 3"  (1.6 m)   Wt 237 lb 14.4 oz (107.9 kg)   SpO2 98%   BMI 42.14 kg/m  BP Readings from Last 3 Encounters:  04/01/23 131/79  11/22/22 138/81  09/07/22 136/65      Physical Exam  Vitals and nursing note reviewed.  Constitutional:      Appearance: Normal appearance. She is normal weight.  HENT:     Head: Normocephalic and atraumatic.     Right Ear: External ear normal.     Left Ear: External ear normal.     Nose: Nose normal.     Mouth/Throat:     Mouth: Mucous membranes are moist.     Pharynx: Oropharynx is clear.  Eyes:     Conjunctiva/sclera: Conjunctivae normal.     Pupils: Pupils are equal, round, and reactive to light.  Cardiovascular:     Rate and Rhythm: Normal rate.  Pulmonary:     Effort: Pulmonary effort is normal.  Skin:    General: Skin is warm.     Capillary Refill: Capillary refill takes less than 2 seconds.     Findings: Rash present.     Comments: Rash across nose and cheeks, papular, dry skin across face  Neurological:     General: No focal deficit present.     Mental Status: She is alert and oriented to person, place, and time. Mental status is at baseline.  Psychiatric:        Mood and Affect: Mood normal.        Behavior:  Behavior normal.        Thought Content: Thought content normal.        Judgment: Judgment normal.    No results found for any visits on 04/01/23.  Last hemoglobin A1c Lab Results  Component Value Date   HGBA1C 5.8 (A) 06/01/2022      The 10-year ASCVD risk score (Arnett DK, et al., 2019) is: 2.8%    Assessment & Plan:   Problem List Items Addressed This Visit   None  OSA (obstructive sleep apnea)  BMI 40.0-44.9, adult (HCC)  Screening for cervical cancer -     Ambulatory referral to Obstetrics / Gynecology  Need for influenza vaccination -     Flu vaccine trivalent PF, 6mos and older(Flulaval,Afluria,Fluarix,Fluzone)  Malar rash -     metroNIDAZOLE; Apply topically daily.  Dispense: 45 g; Refill: 0   Advised pt that her fatigue could be from OSA untreated. She will try to get in touch with DME company to get her set up with her CPAP.  Pt has lost weight. Job well done and to continue her Metformin daily.  Refer to OB/GYN for pap smear per pt's request Flu vaccine today Discussed with pt that her rash resembles rosacea. To treat with Metrogel daily. Follow up if no better. Discussed using washes on face that are hypoallogeneic. To follow up in a few weeks if condition worsens. Otherwise, will see in 4 months for CPE/labs.  No follow-ups on file.    Suzan Slick, MD

## 2023-04-02 ENCOUNTER — Other Ambulatory Visit: Payer: Self-pay | Admitting: Family Medicine

## 2023-04-02 DIAGNOSIS — R21 Rash and other nonspecific skin eruption: Secondary | ICD-10-CM

## 2023-04-02 NOTE — Telephone Encounter (Signed)
Sent alternative

## 2023-05-20 ENCOUNTER — Encounter (HOSPITAL_BASED_OUTPATIENT_CLINIC_OR_DEPARTMENT_OTHER): Payer: BLUE CROSS/BLUE SHIELD | Admitting: Certified Nurse Midwife

## 2023-08-09 ENCOUNTER — Encounter: Payer: BLUE CROSS/BLUE SHIELD | Admitting: Family Medicine

## 2023-08-16 ENCOUNTER — Encounter: Admitting: Family Medicine

## 2023-08-25 ENCOUNTER — Other Ambulatory Visit: Payer: Self-pay | Admitting: Family Medicine

## 2023-08-25 DIAGNOSIS — E782 Mixed hyperlipidemia: Secondary | ICD-10-CM

## 2023-12-05 ENCOUNTER — Other Ambulatory Visit: Payer: Self-pay | Admitting: Family Medicine

## 2023-12-05 DIAGNOSIS — I10 Essential (primary) hypertension: Secondary | ICD-10-CM

## 2023-12-05 DIAGNOSIS — R7303 Prediabetes: Secondary | ICD-10-CM

## 2023-12-14 ENCOUNTER — Other Ambulatory Visit: Payer: Self-pay | Admitting: Family Medicine

## 2023-12-14 DIAGNOSIS — I10 Essential (primary) hypertension: Secondary | ICD-10-CM

## 2024-05-19 ENCOUNTER — Encounter (HOSPITAL_COMMUNITY): Payer: Self-pay

## 2024-05-19 ENCOUNTER — Other Ambulatory Visit: Payer: Self-pay

## 2024-05-19 ENCOUNTER — Emergency Department (HOSPITAL_COMMUNITY)

## 2024-05-19 ENCOUNTER — Emergency Department (HOSPITAL_COMMUNITY)
Admission: EM | Admit: 2024-05-19 | Discharge: 2024-05-19 | Disposition: A | Discharge status: Home / Self Care | Attending: Emergency Medicine | Admitting: Emergency Medicine

## 2024-05-19 DIAGNOSIS — J45909 Unspecified asthma, uncomplicated: Secondary | ICD-10-CM | POA: Insufficient documentation

## 2024-05-19 DIAGNOSIS — I10 Essential (primary) hypertension: Secondary | ICD-10-CM | POA: Insufficient documentation

## 2024-05-19 DIAGNOSIS — Z79899 Other long term (current) drug therapy: Secondary | ICD-10-CM | POA: Insufficient documentation

## 2024-05-19 DIAGNOSIS — K439 Ventral hernia without obstruction or gangrene: Secondary | ICD-10-CM | POA: Diagnosis not present

## 2024-05-19 DIAGNOSIS — D72819 Decreased white blood cell count, unspecified: Secondary | ICD-10-CM | POA: Insufficient documentation

## 2024-05-19 DIAGNOSIS — R109 Unspecified abdominal pain: Secondary | ICD-10-CM | POA: Diagnosis present

## 2024-05-19 DIAGNOSIS — R300 Dysuria: Secondary | ICD-10-CM

## 2024-05-19 LAB — COMPREHENSIVE METABOLIC PANEL WITH GFR
ALT: 13 U/L (ref 0–44)
AST: 14 U/L — ABNORMAL LOW (ref 15–41)
Albumin: 4 g/dL (ref 3.5–5.0)
Alkaline Phosphatase: 69 U/L (ref 38–126)
Anion gap: 10 (ref 5–15)
BUN: 9 mg/dL (ref 6–20)
CO2: 26 mmol/L (ref 22–32)
Calcium: 8.7 mg/dL — ABNORMAL LOW (ref 8.9–10.3)
Chloride: 105 mmol/L (ref 98–111)
Creatinine, Ser: 0.64 mg/dL (ref 0.44–1.00)
GFR, Estimated: >60 mL/min
Glucose, Bld: 89 mg/dL (ref 70–99)
Potassium: 4.3 mmol/L (ref 3.5–5.1)
Sodium: 141 mmol/L (ref 135–145)
Total Bilirubin: 0.3 mg/dL (ref 0.0–1.2)
Total Protein: 6.4 g/dL — ABNORMAL LOW (ref 6.5–8.1)

## 2024-05-19 LAB — CBC WITH DIFFERENTIAL/PLATELET
Abs Immature Granulocytes: 0.03 10*3/uL (ref 0.00–0.07)
Basophils Absolute: 0 10*3/uL (ref 0.0–0.1)
Basophils Relative: 0 %
Eosinophils Absolute: 0.1 10*3/uL (ref 0.0–0.5)
Eosinophils Relative: 1 %
HCT: 37.1 % (ref 36.0–46.0)
Hemoglobin: 12 g/dL (ref 12.0–15.0)
Immature Granulocytes: 0 %
Lymphocytes Relative: 27 %
Lymphs Abs: 2.2 10*3/uL (ref 0.7–4.0)
MCH: 31.1 pg (ref 26.0–34.0)
MCHC: 32.3 g/dL (ref 30.0–36.0)
MCV: 96.1 fL (ref 80.0–100.0)
Monocytes Absolute: 0.7 10*3/uL (ref 0.1–1.0)
Monocytes Relative: 8 %
Neutro Abs: 5.2 10*3/uL (ref 1.7–7.7)
Neutrophils Relative %: 64 %
Platelets: 416 10*3/uL — ABNORMAL HIGH (ref 150–400)
RBC: 3.86 MIL/uL — ABNORMAL LOW (ref 3.87–5.11)
RDW: 13.1 % (ref 11.5–15.5)
WBC: 8.2 10*3/uL (ref 4.0–10.5)
nRBC: 0 % (ref 0.0–0.2)

## 2024-05-19 LAB — URINALYSIS, ROUTINE W REFLEX MICROSCOPIC
Bilirubin Urine: NEGATIVE
Glucose, UA: NEGATIVE mg/dL
Hgb urine dipstick: NEGATIVE
Ketones, ur: NEGATIVE mg/dL
Nitrite: NEGATIVE
Protein, ur: NEGATIVE mg/dL
Specific Gravity, Urine: 1.011 (ref 1.005–1.030)
pH: 7 (ref 5.0–8.0)

## 2024-05-19 LAB — LIPASE, BLOOD: Lipase: 36 U/L (ref 11–51)

## 2024-05-19 LAB — PREGNANCY, URINE: Preg Test, Ur: NEGATIVE

## 2024-05-19 MED ORDER — IOHEXOL 300 MG/ML  SOLN
100.0000 mL | Freq: Once | INTRAMUSCULAR | Status: AC | PRN
  Administered 2024-05-19: 100 mL via INTRAVENOUS

## 2024-05-19 MED ORDER — CEPHALEXIN 500 MG PO CAPS: 500.0000 mg | ORAL_CAPSULE | Freq: Two times a day (BID) | ORAL | 0 refills | Status: AC

## 2024-05-19 MED ORDER — ONDANSETRON HCL 4 MG PO TABS: 4.0000 mg | ORAL_TABLET | Freq: Three times a day (TID) | ORAL | 0 refills | Status: AC | PRN

## 2024-05-19 MED ORDER — TRAMADOL HCL 50 MG PO TABS: 50.0000 mg | ORAL_TABLET | Freq: Four times a day (QID) | ORAL | 0 refills | Status: DC | PRN

## 2024-05-19 NOTE — ED Triage Notes (Signed)
 Pt reports  Abdomen pain/bloating Ongoing for a few days Upper mid abdomen Hx of constipation Took miralax  Last BM 1/26 Nausea Ongoing a few days

## 2024-05-19 NOTE — ED Provider Notes (Signed)
 " [place] EMERGENCY DEPARTMENT AT [facility] Provider Note   CSN: [id number] Arrival date & time: 05/19/24  9060     Patient presents with: Abdominal Pain, Bloated, and Emesis   Darlene Christian is a 54 y.o. female with a history including asthma, hypertension, hyperlipidemia, surgical history significant for appendectomy, cholecystectomy presenting with a several day history of upper abdominal pain.  She endorses feeling generalized bloating and has had a history of recent constipation as well.  She also notes a localized swelling at the site of her pain which is more noticeable when sitting and standing, improves when supine.  She has taken MiraLAX, and did have a small bowel movement yesterday but did not significantly relieve her symptoms.  She also endorses some nausea without emesis for the past few days.  She has had no fevers or chills, she does report increased urinary frequency and mild burning intermittently during urination for the past several days, no hematuria, back or flank pain.  She also denies chest pain and shortness of breath.   The history is provided by the patient.       Prior to Admission medications  Medication Sig Start Date End Date Taking? Authorizing Provider  cephALEXin  (KEFLEX ) 500 MG capsule Take 1 capsule (500 mg total) by mouth 2 (two) times daily for 5 days. 05/19/24 05/24/24 Yes Tomiko Schoon, Mliss, PA-C  ondansetron  (ZOFRAN ) 4 MG tablet Take 1 tablet (4 mg total) by mouth every 8 (eight) hours as needed. 05/19/24  Yes Alante Weimann, PA-C  traMADol  (ULTRAM ) 50 MG tablet Take 1 tablet (50 mg total) by mouth every 6 (six) hours as needed. 05/19/24  Yes Radley Teston, Mliss, PA-C  atorvastatin  (LIPITOR) 10 MG tablet TAKE 1 TABLET BY MOUTH EVERYDAY AT BEDTIME 08/26/23   Rucker, Torrence GRADE, MD  escitalopram  (LEXAPRO ) 10 MG tablet Take 0.5 tablets (5 mg total) by mouth daily. 11/22/22 01/06/23  Colette Torrence GRADE, MD  fluticasone (FLONASE) 50 MCG/ACT nasal spray Place 1 spray into  both nostrils daily.    [provider]  hydrochlorothiazide  (HYDRODIURIL ) 25 MG tablet TAKE 1 TABLET (25 MG TOTAL) BY MOUTH DAILY. 12/05/23   Colette Torrence GRADE, MD  Ipratropium-Albuterol  (COMBIVENT  RESPIMAT) 20-100 MCG/ACT AERS respimat Inhale 1 puff into the lungs every 6 (six) hours as needed. 06/01/22   Colette Torrence GRADE, MD  metFORMIN  (GLUCOPHAGE -XR) 500 MG 24 hr tablet TAKE 1 TABLET BY MOUTH EVERY DAY WITH BREAKFAST 12/05/23   Colette Torrence GRADE, MD  metroNIDAZOLE  (METROGEL ) 0.75 % gel Apply 1 Application topically 2 (two) times daily. 04/02/23   Colette Torrence GRADE, MD  potassium chloride  SA (KLOR-CON  M) 20 MEQ tablet TAKE 1 TABLET BY MOUTH EVERY DAY 12/16/23   Colette Torrence GRADE, MD    Allergies: Cat dander, Clarithromycin, Sulfa antibiotics, Azithromycin, Capsaicin, and Lasix [furosemide]    Review of Systems  Constitutional:  Negative for chills and fever.  HENT:  Negative for congestion.   Eyes: Negative.   Respiratory:  Negative for chest tightness and shortness of breath.   Cardiovascular:  Negative for chest pain.  Gastrointestinal:  Positive for abdominal distention, abdominal pain, constipation and nausea. Negative for diarrhea and vomiting.  Genitourinary: Negative.  Negative for dysuria.  Musculoskeletal:  Negative for arthralgias, joint swelling and neck pain.  Skin: Negative.  Negative for rash and wound.  Neurological:  Negative for dizziness, weakness, light-headedness, numbness and headaches.  Psychiatric/Behavioral: Negative.      Updated Vital Signs BP (!) 148/70   Pulse 61  Temp 98.6 F (37 C) (Oral)   Resp 18   SpO2 97%   Physical Exam Vitals and nursing note reviewed.  Constitutional:      Appearance: She is well-developed.  HENT:     Head: Normocephalic and atraumatic.  Eyes:     Conjunctiva/sclera: Conjunctivae normal.  Cardiovascular:     Rate and Rhythm: Normal rate and regular rhythm.     Heart sounds: Normal heart sounds.  Pulmonary:      Effort: Pulmonary effort is normal.     Breath sounds: Normal breath sounds. No wheezing.  Abdominal:     General: Bowel sounds are normal.     Palpations: Abdomen is soft.     Tenderness: There is no abdominal tenderness.     Hernia: A hernia is present. Hernia is present in the ventral area.     Comments: Small supraumbilical ventral hernia which easily reduces.  Musculoskeletal:        General: Normal range of motion.     Cervical back: Normal range of motion.  Skin:    General: Skin is warm and dry.  Neurological:     Mental Status: She is alert.     (all labs ordered are listed, but only abnormal results are displayed) Labs Reviewed  CBC WITH DIFFERENTIAL/PLATELET - Abnormal; Notable for the following components:      Result Value   RBC 3.86 (*)    Platelets 416 (*)    All other components within normal limits  COMPREHENSIVE METABOLIC PANEL WITH GFR - Abnormal; Notable for the following components:   Calcium  8.7 (*)    Total Protein 6.4 (*)    AST 14 (*)    All other components within normal limits  URINALYSIS, ROUTINE W REFLEX MICROSCOPIC - Abnormal; Notable for the following components:   APPearance CLOUDY (*)    Leukocytes,Ua MODERATE (*)    Bacteria, UA RARE (*)    All other components within normal limits  LIPASE, BLOOD  PREGNANCY, URINE    EKG: None  Radiology: CT ABDOMEN PELVIS W CONTRAST Result Date: 05/19/2024 EXAM: CT ABDOMEN AND PELVIS WITH CONTRAST 05/19/2024 01:30:20 PM TECHNIQUE: CT of the abdomen and pelvis was performed with the administration of 100 mL iohexol  (OMNIPAQUE ) 300 MG/ML solution. Multiplanar reformatted images are provided for review. Automated exposure control, iterative reconstruction, and/or weight-based adjustment of the milliamperes/kilovoltage was utilized to reduce the radiation dose to as low as reasonably achievable. COMPARISON: None available. CLINICAL HISTORY: Bowel obstruction suspected; localizing pain and swelling  supraumbilical. Hernia suspected. FINDINGS: LOWER CHEST: No acute abnormality. LIVER: The liver is unremarkable. GALLBLADDER AND BILE DUCTS: Cholecystectomy. No biliary ductal dilatation. SPLEEN: No acute abnormality. PANCREAS: No acute abnormality. ADRENAL GLANDS: No acute abnormality. KIDNEYS, URETERS AND BLADDER: No stones in the kidneys or ureters. No hydronephrosis. No perinephric or periureteral stranding. Partially distended urinary bladder without focal abnormality. GI AND BOWEL: Stomach demonstrates no acute abnormality. There is no bowel obstruction. PERITONEUM AND RETROPERITONEUM: No ascites. No free air. No free pelvic fluid. Small amount of inflammatory stranding of the omental fat in the hernia sac. VASCULATURE: Aorta is normal in caliber. LYMPH NODES: No lymphadenopathy. REPRODUCTIVE ORGANS: Intramural fibroid along the right uterine body measuring 1.8 cm. Small corpus luteum cyst in the left ovary. BONES AND SOFT TISSUES: No acute osseous abnormality. There is a small fat containing supraumbilical ventral hernia measuring 3 x 6.1 x 3.9 cm with a 1.5 cm neck. IMPRESSION: 1. Small fat containing supraumbilical ventral hernia with a  1.5 cm neck. Small amount of inflammatory stranding of the omental fat in the hernia sac, which may represent early changes of strangulation. Correlation for reducibility recommended. 2. No evidence of bowel obstruction. Electronically signed by: Rogelia Myers MD 05/19/2024 02:40 PM EST RP Workstation: [id number]     Procedures   Medications Ordered in the ED  iohexol  (OMNIPAQUE ) 300 MG/ML solution 100 mL (100 mLs Intravenous Contrast Given 05/19/24 1320)                                    Medical Decision Making Patient presenting with abdominal pain, localizing to the supraumbilical region, exam suggests hernia which appears to reduce easily with gentle pressure.  Labs and CT imaging were obtained to rule out incarcerated bowel and is reassuringly negative  for this finding.  Differential diagnoses also would include small bowel obstruction, intra-abdominal abscess or mass.  Labs and imaging as outlined below.  Given moderate leukocytes in her urine and her history of dysuria, she was started on Keflex .  She will follow-up with Dr. Mavis in 2 days for further evaluation and management of this ventral hernia.  Amount and/or Complexity of Data Reviewed Labs: ordered.    Details: Labs are significant for moderate leukocytes, rare bacteria, c-Met and CBC reviewed, normal WBC count at 8.2 Radiology: ordered.    Details: CT revealing for small fat-containing supraumbilical ventral hernia. Discussion of management or test interpretation with external provider(s): Patient discussed with Dr. Mavis who will see the patient in his office in 2 days.  In the interim she was given pain and nausea medicine.  Risk Prescription drug management.        Final diagnoses:  Ventral hernia without obstruction or gangrene  Dysuria    ED Discharge Orders          Ordered    traMADol  (ULTRAM ) 50 MG tablet  Every 6 hours PRN        05/19/24 1504    ondansetron  (ZOFRAN ) 4 MG tablet  Every 8 hours PRN        05/19/24 1504    cephALEXin  (KEFLEX ) 500 MG capsule  2 times daily        05/19/24 1509               Hertha Gergen, PA-C 05/23/24 1918  "

## 2024-05-19 NOTE — Discharge Instructions (Signed)
 As discussed, you have a small ventral hernia with fat contained within the hernia, which is painful but does not require emergent surgery (like would be the case if there was intestine trapped within the space.  It will be important for you to have close surgical followup for this - call Dr Mavis office for an appointment.    You have been prescribed some pain and nausea medicine.  You may also try ice packs to the site of the pain before applying gentle pressure which can help reduce it easier.  Return here for any escalation of pain, vomiting or development of fever.    I am prescribing antibiotics as well given your intermittent dysuria complaint and equivocal urine findings.

## 2024-05-21 ENCOUNTER — Encounter: Payer: Self-pay | Admitting: General Surgery

## 2024-05-21 ENCOUNTER — Ambulatory Visit: Payer: Self-pay | Admitting: General Surgery

## 2024-05-21 VITALS — BP 139/82 | HR 70 | Temp 98.3°F | Resp 14 | Ht 63.0 in | Wt 218.0 lb

## 2024-05-21 DIAGNOSIS — K436 Other and unspecified ventral hernia with obstruction, without gangrene: Secondary | ICD-10-CM

## 2024-05-21 NOTE — H&P (Signed)
 Darlene Christian; [id number]; [birth year]   HPI Patient is a 54 year old white female who was referred to my care by Dr. Colette in the emergency room for evaluation and treatment of an incarcerated ventral hernia.  She was seen in the emergency room on 05/19/2024 with worsening abdominal pain, nausea, and a hard knot in the supraumbilical region.  A CT scan of the abdomen revealed incarcerated omentum.  Reportedly it was partially reduced.  The patient was discharged home on pain medication.  Since that time, she has had ongoing low-grade pain in this region.  It is made worse when touching it or straining.  She has had a swelling there intermittently for some time now.  This was her first episode of diagnosed incarceration. Past Medical History:  Diagnosis Date   Anxiety    Asthma    Depression    Heart murmur    HLD (hyperlipidemia)    Hypertension    OSA (obstructive sleep apnea)    HST showed mild  OSA with AHI 12/ hr Suggest autoCPAP  5-15 cm, mask of choice    Past Surgical History:  Procedure Laterality Date   APPENDECTOMY     CHOLECYSTECTOMY     laproscopy     TONSILLECTOMY      Family History  Problem Relation Age of Onset   Hypertension Mother    Aneurysm Mother        brain   Hypertension Father    Breast cancer Maternal Grandmother    Uterine cancer Maternal Grandmother    Liver cancer Maternal Grandmother    Diabetes Maternal Grandmother    Throat cancer Maternal Grandfather    Aneurysm Maternal Grandfather    Hypertension Maternal Grandfather    Breast cancer Paternal Grandmother    Melanoma Paternal Grandmother    Hypertension Paternal Grandmother    Diabetes Paternal Grandfather    Colon cancer Paternal Grandfather     [Medications Ordered Prior to [employer] Ordered Prior to [employer] Current Outpatient Medications on File Prior to Visit  Medication Sig Dispense Refill   atorvastatin  (LIPITOR) 10 MG tablet TAKE 1 TABLET BY MOUTH EVERYDAY AT  BEDTIME 90 tablet 3   buPROPion (WELLBUTRIN SR) 150 MG 12 hr tablet Take 150 mg by mouth 2 (two) times daily.     cephALEXin  (KEFLEX ) 500 MG capsule Take 1 capsule (500 mg total) by mouth 2 (two) times daily for 5 days. 10 capsule 0   hydrochlorothiazide  (HYDRODIURIL ) 25 MG tablet TAKE 1 TABLET (25 MG TOTAL) BY MOUTH DAILY. 30 tablet 0   Ipratropium-Albuterol  (COMBIVENT  RESPIMAT) 20-100 MCG/ACT AERS respimat Inhale 1 puff into the lungs every 6 (six) hours as needed. 4 g 5   metFORMIN  (GLUCOPHAGE -XR) 500 MG 24 hr tablet TAKE 1 TABLET BY MOUTH EVERY DAY WITH BREAKFAST 30 tablet 0   ondansetron  (ZOFRAN ) 4 MG tablet Take 1 tablet (4 mg total) by mouth every 8 (eight) hours as needed. 20 tablet 0   potassium chloride  SA (KLOR-CON  M) 20 MEQ tablet TAKE 1 TABLET BY MOUTH EVERY DAY 90 tablet 1   traMADol  (ULTRAM ) 50 MG tablet Take 1 tablet (50 mg total) by mouth every 6 (six) hours as needed. 20 tablet 0   NALTREXONE HCL, PAIN, PO Take 1 mg by mouth. (Patient not taking: Reported on 05/21/2024)     No current facility-administered medications on file prior to visit.    [Allergies]  [Allergies] Allergen Reactions   Cat Dander Hives   Clarithromycin Hives   Sulfa  Antibiotics Hives   Azithromycin Hives   Capsaicin    Lasix [Furosemide] Hives    Social History   Substance and Sexual Activity  Alcohol Use Yes   Comment: occ    [Tobacco Use History]  [Tobacco Use History] Tobacco Use  Smoking Status Never  Smokeless Tobacco Never    Review of Systems  Constitutional: Negative.   HENT: Negative.    Eyes: Negative.   Respiratory: Negative.    Cardiovascular: Negative.   Gastrointestinal:  Positive for abdominal pain and nausea.  Genitourinary:  Positive for dysuria.  Musculoskeletal:  Positive for back pain.  Skin: Negative.   Neurological: Negative.   Endo/Heme/Allergies: Negative.   Psychiatric/Behavioral: Negative.      Objective   Vitals:   05/21/24 1005  BP: 139/82   Pulse: 70  Resp: 14  Temp: 98.3 F (36.8 C)  SpO2: 94%    Physical Exam Vitals reviewed.  Constitutional:      Appearance: Normal appearance. She is not ill-appearing.  HENT:     Head: Normocephalic and atraumatic.  Cardiovascular:     Rate and Rhythm: Normal rate and regular rhythm.     Heart sounds: Normal heart sounds. No murmur heard.    No friction rub. No gallop.  Pulmonary:     Effort: Pulmonary effort is normal. No respiratory distress.     Breath sounds: Normal breath sounds. No stridor. No wheezing, rhonchi or rales.  Abdominal:     General: There is no distension.     Palpations: Abdomen is soft. There is no mass.     Tenderness: There is abdominal tenderness. There is no guarding or rebound.     Hernia: A hernia is present.     Comments: Patient does have a supraumbilical hernia that is approximately 3 cm in its greatest diameter.  It is partially reducible and soft.  It is tender to touch.  No rigidity is noted.  Skin:    General: Skin is warm and dry.  Neurological:     Mental Status: She is alert and oriented to person, place, and time.   CT scan images personally reviewed  Assessment  Ventral hernia with incarcerated omentum Plan  Patient is scheduled for robotic assisted laparoscopic ventral herniorrhaphy with mesh on 05/26/2024.  The risks and benefits of the procedure including bleeding, infection, mesh use, and the possibility of recurrence of the hernia were fully explained to the patient, who gave informed consent.

## 2024-05-21 NOTE — Progress Notes (Signed)
 Darlene Christian; [id number]; Sep 18, 1970   HPI Patient is a 54 year old white female who was referred to my care by Dr. Colette in the emergency room for evaluation and treatment of an incarcerated ventral hernia.  She was seen in the emergency room on 05/19/2024 with worsening abdominal pain, nausea, and a hard knot in the supraumbilical region.  A CT scan of the abdomen revealed incarcerated omentum.  Reportedly it was partially reduced.  The patient was discharged home on pain medication.  Since that time, she has had ongoing low-grade pain in this region.  It is made worse when touching it or straining.  She has had a swelling there intermittently for some time now.  This was her first episode of diagnosed incarceration. Past Medical History:  Diagnosis Date   Anxiety    Asthma    Depression    Heart murmur    HLD (hyperlipidemia)    Hypertension    OSA (obstructive sleep apnea)    HST showed mild  OSA with AHI 12/ hr Suggest autoCPAP  5-15 cm, mask of choice    Past Surgical History:  Procedure Laterality Date   APPENDECTOMY     CHOLECYSTECTOMY     laproscopy     TONSILLECTOMY      Family History  Problem Relation Age of Onset   Hypertension Mother    Aneurysm Mother        brain   Hypertension Father    Breast cancer Maternal Grandmother    Uterine cancer Maternal Grandmother    Liver cancer Maternal Grandmother    Diabetes Maternal Grandmother    Throat cancer Maternal Grandfather    Aneurysm Maternal Grandfather    Hypertension Maternal Grandfather    Breast cancer Paternal Grandmother    Melanoma Paternal Grandmother    Hypertension Paternal Grandmother    Diabetes Paternal Grandfather    Colon cancer Paternal Grandfather     Medications Ordered Prior to Encounter[1]  Allergies[2]  Social History   Substance and Sexual Activity  Alcohol Use Yes   Comment: occ    Tobacco Use History[3]  Review of Systems  Constitutional: Negative.   HENT: Negative.     Eyes: Negative.   Respiratory: Negative.    Cardiovascular: Negative.   Gastrointestinal:  Positive for abdominal pain and nausea.  Genitourinary:  Positive for dysuria.  Musculoskeletal:  Positive for back pain.  Skin: Negative.   Neurological: Negative.   Endo/Heme/Allergies: Negative.   Psychiatric/Behavioral: Negative.      Objective   Vitals:   05/21/24 1005  BP: 139/82  Pulse: 70  Resp: 14  Temp: 98.3 F (36.8 C)  SpO2: 94%    Physical Exam Vitals reviewed.  Constitutional:      Appearance: Normal appearance. She is not ill-appearing.  HENT:     Head: Normocephalic and atraumatic.  Cardiovascular:     Rate and Rhythm: Normal rate and regular rhythm.     Heart sounds: Normal heart sounds. No murmur heard.    No friction rub. No gallop.  Pulmonary:     Effort: Pulmonary effort is normal. No respiratory distress.     Breath sounds: Normal breath sounds. No stridor. No wheezing, rhonchi or rales.  Abdominal:     General: There is no distension.     Palpations: Abdomen is soft. There is no mass.     Tenderness: There is abdominal tenderness. There is no guarding or rebound.     Hernia: A hernia is present.  Comments: Patient does have a supraumbilical hernia that is approximately 3 cm in its greatest diameter.  It is partially reducible and soft.  It is tender to touch.  No rigidity is noted.  Skin:    General: Skin is warm and dry.  Neurological:     Mental Status: She is alert and oriented to person, place, and time.   CT scan images personally reviewed  Assessment  Ventral hernia with incarcerated omentum Plan  Patient is scheduled for robotic assisted laparoscopic ventral herniorrhaphy with mesh on 05/26/2024.  The risks and benefits of the procedure including bleeding, infection, mesh use, and the possibility of recurrence of the hernia were fully explained to the patient, who gave informed consent.    [1]  Current Outpatient Medications on File Prior  to Visit  Medication Sig Dispense Refill   atorvastatin  (LIPITOR) 10 MG tablet TAKE 1 TABLET BY MOUTH EVERYDAY AT BEDTIME 90 tablet 3   buPROPion (WELLBUTRIN SR) 150 MG 12 hr tablet Take 150 mg by mouth 2 (two) times daily.     cephALEXin  (KEFLEX ) 500 MG capsule Take 1 capsule (500 mg total) by mouth 2 (two) times daily for 5 days. 10 capsule 0   hydrochlorothiazide  (HYDRODIURIL ) 25 MG tablet TAKE 1 TABLET (25 MG TOTAL) BY MOUTH DAILY. 30 tablet 0   Ipratropium-Albuterol  (COMBIVENT  RESPIMAT) 20-100 MCG/ACT AERS respimat Inhale 1 puff into the lungs every 6 (six) hours as needed. 4 g 5   metFORMIN  (GLUCOPHAGE -XR) 500 MG 24 hr tablet TAKE 1 TABLET BY MOUTH EVERY DAY WITH BREAKFAST 30 tablet 0   ondansetron  (ZOFRAN ) 4 MG tablet Take 1 tablet (4 mg total) by mouth every 8 (eight) hours as needed. 20 tablet 0   potassium chloride  SA (KLOR-CON  M) 20 MEQ tablet TAKE 1 TABLET BY MOUTH EVERY DAY 90 tablet 1   traMADol  (ULTRAM ) 50 MG tablet Take 1 tablet (50 mg total) by mouth every 6 (six) hours as needed. 20 tablet 0   NALTREXONE HCL, PAIN, PO Take 1 mg by mouth. (Patient not taking: Reported on 05/21/2024)     No current facility-administered medications on file prior to visit.  [2]  Allergies Allergen Reactions   Cat Dander Hives   Clarithromycin Hives   Sulfa Antibiotics Hives   Azithromycin Hives   Capsaicin    Lasix [Furosemide] Hives  [3]  Social History Tobacco Use  Smoking Status Never  Smokeless Tobacco Never

## 2024-05-22 ENCOUNTER — Encounter (HOSPITAL_COMMUNITY)
Admission: RE | Admit: 2024-05-22 | Discharge: 2024-05-22 | Disposition: A | Source: Ambulatory Visit | Attending: General Surgery

## 2024-05-22 ENCOUNTER — Other Ambulatory Visit: Payer: Self-pay

## 2024-05-22 ENCOUNTER — Encounter (HOSPITAL_COMMUNITY): Payer: Self-pay

## 2024-05-22 VITALS — Wt 218.0 lb

## 2024-05-22 DIAGNOSIS — I10 Essential (primary) hypertension: Secondary | ICD-10-CM

## 2024-05-22 NOTE — Progress Notes (Signed)
 PAT phone call completed. Pt verbalized understanding on procedure instructions and arrival time

## 2024-05-25 ENCOUNTER — Other Ambulatory Visit (HOSPITAL_COMMUNITY)

## 2024-05-26 ENCOUNTER — Ambulatory Visit (HOSPITAL_COMMUNITY)
Admission: RE | Admit: 2024-05-26 | Discharge: 2024-05-26 | Disposition: A | Attending: General Surgery | Admitting: General Surgery

## 2024-05-26 ENCOUNTER — Encounter (HOSPITAL_COMMUNITY): Payer: Self-pay | Admitting: Anesthesiology

## 2024-05-26 ENCOUNTER — Other Ambulatory Visit: Payer: Self-pay

## 2024-05-26 ENCOUNTER — Encounter (HOSPITAL_COMMUNITY): Payer: Self-pay | Admitting: General Surgery

## 2024-05-26 ENCOUNTER — Encounter (HOSPITAL_COMMUNITY)
Admission: RE | Disposition: A | Discharge status: Home / Self Care | Payer: Self-pay | Source: Home / Self Care | Attending: General Surgery

## 2024-05-26 DIAGNOSIS — G4733 Obstructive sleep apnea (adult) (pediatric): Secondary | ICD-10-CM | POA: Insufficient documentation

## 2024-05-26 DIAGNOSIS — F419 Anxiety disorder, unspecified: Secondary | ICD-10-CM | POA: Insufficient documentation

## 2024-05-26 DIAGNOSIS — K429 Umbilical hernia without obstruction or gangrene: Secondary | ICD-10-CM | POA: Insufficient documentation

## 2024-05-26 DIAGNOSIS — K430 Incisional hernia with obstruction, without gangrene: Secondary | ICD-10-CM

## 2024-05-26 DIAGNOSIS — F32A Depression, unspecified: Secondary | ICD-10-CM | POA: Insufficient documentation

## 2024-05-26 DIAGNOSIS — I10 Essential (primary) hypertension: Secondary | ICD-10-CM | POA: Insufficient documentation

## 2024-05-26 DIAGNOSIS — J45909 Unspecified asthma, uncomplicated: Secondary | ICD-10-CM | POA: Insufficient documentation

## 2024-05-26 LAB — GLUCOSE, CAPILLARY: Glucose-Capillary: 92 mg/dL (ref 70–99)

## 2024-05-26 MED ORDER — OXYCODONE HCL 5 MG PO TABS: 5.0000 mg | ORAL_TABLET | Freq: Once | ORAL | Status: AC | PRN

## 2024-05-26 MED ORDER — FENTANYL CITRATE (PF) 50 MCG/ML IJ SOSY
25.0000 ug | PREFILLED_SYRINGE | INTRAMUSCULAR | Status: DC | PRN
  Administered 2024-05-26 (×3): 50 ug via INTRAVENOUS
  Filled 2024-05-26 (×3): qty 1

## 2024-05-26 MED ORDER — STERILE WATER FOR IRRIGATION IR SOLN
Status: DC | PRN
  Administered 2024-05-26: 500 mL

## 2024-05-26 MED ORDER — MIDAZOLAM HCL 2 MG/2ML IJ SOLN
INTRAMUSCULAR | Status: AC
  Filled 2024-05-26: qty 2

## 2024-05-26 MED ORDER — FENTANYL CITRATE (PF) 250 MCG/5ML IJ SOLN
INTRAMUSCULAR | Status: DC | PRN
  Administered 2024-05-26 (×2): 100 ug via INTRAVENOUS

## 2024-05-26 MED ORDER — CHLORHEXIDINE GLUCONATE CLOTH 2 % EX PADS: 6.0000 | MEDICATED_PAD | Freq: Once | CUTANEOUS | Status: DC

## 2024-05-26 MED ORDER — OXYCODONE HCL 5 MG PO TABS: 5.0000 mg | ORAL_TABLET | ORAL | 0 refills | Status: AC | PRN

## 2024-05-26 MED ORDER — LIDOCAINE 2% (20 MG/ML) 5 ML SYRINGE
INTRAMUSCULAR | Status: AC
  Filled 2024-05-26: qty 5

## 2024-05-26 MED ORDER — ROCURONIUM BROMIDE 10 MG/ML (PF) SYRINGE
PREFILLED_SYRINGE | INTRAVENOUS | Status: DC | PRN
  Administered 2024-05-26: 30 mg via INTRAVENOUS
  Administered 2024-05-26: 70 mg via INTRAVENOUS

## 2024-05-26 MED ORDER — LACTATED RINGERS IV SOLN: INTRAVENOUS | Status: DC

## 2024-05-26 MED ORDER — PROPOFOL 10 MG/ML IV BOLUS
INTRAVENOUS | Status: DC | PRN
  Administered 2024-05-26: 180 mg via INTRAVENOUS

## 2024-05-26 MED ORDER — CHLORHEXIDINE GLUCONATE 0.12 % MT SOLN
15.0000 mL | Freq: Once | OROMUCOSAL | Status: AC
  Administered 2024-05-26: 15 mL via OROMUCOSAL
  Filled 2024-05-26: qty 15

## 2024-05-26 MED ORDER — ROCURONIUM BROMIDE 10 MG/ML (PF) SYRINGE
PREFILLED_SYRINGE | INTRAVENOUS | Status: AC
  Filled 2024-05-26: qty 10

## 2024-05-26 MED ORDER — MIDAZOLAM HCL (PF) 2 MG/2ML IJ SOLN
INTRAMUSCULAR | Status: DC | PRN
  Administered 2024-05-26: 2 mg via INTRAVENOUS

## 2024-05-26 MED ORDER — ORAL CARE MOUTH RINSE: 15.0000 mL | Freq: Once | OROMUCOSAL | Status: AC

## 2024-05-26 MED ORDER — BUPIVACAINE HCL (PF) 0.5 % IJ SOLN
INTRAMUSCULAR | Status: DC | PRN
  Administered 2024-05-26: 30 mL

## 2024-05-26 MED ORDER — BUPIVACAINE HCL (PF) 0.5 % IJ SOLN
INTRAMUSCULAR | Status: AC
  Filled 2024-05-26: qty 30

## 2024-05-26 MED ORDER — LIDOCAINE 2% (20 MG/ML) 5 ML SYRINGE
INTRAMUSCULAR | Status: DC | PRN
  Administered 2024-05-26: 80 mg via INTRAVENOUS

## 2024-05-26 MED ORDER — ONDANSETRON HCL 4 MG/2ML IJ SOLN: 4.0000 mg | Freq: Once | INTRAMUSCULAR | Status: DC | PRN

## 2024-05-26 MED ORDER — METHOCARBAMOL 500 MG PO TABS: 500.0000 mg | ORAL_TABLET | Freq: Four times a day (QID) | ORAL | 1 refills | Status: AC

## 2024-05-26 MED ORDER — CEFAZOLIN SODIUM-DEXTROSE 2-4 GM/100ML-% IV SOLN
2.0000 g | INTRAVENOUS | Status: AC
  Administered 2024-05-26: 2 g via INTRAVENOUS
  Filled 2024-05-26: qty 100

## 2024-05-26 MED ORDER — FENTANYL CITRATE (PF) 250 MCG/5ML IJ SOLN
INTRAMUSCULAR | Status: AC
  Filled 2024-05-26: qty 5

## 2024-05-26 MED ORDER — PROPOFOL 10 MG/ML IV BOLUS
INTRAVENOUS | Status: AC
  Filled 2024-05-26: qty 20

## 2024-05-26 MED ORDER — DEXAMETHASONE SOD PHOSPHATE PF 10 MG/ML IJ SOLN
INTRAMUSCULAR | Status: DC | PRN
  Administered 2024-05-26: 10 mg via INTRAVENOUS

## 2024-05-26 MED ORDER — SEVOFLURANE IN SOLN
RESPIRATORY_TRACT | Status: AC
  Filled 2024-05-26: qty 250

## 2024-05-26 MED ORDER — ONDANSETRON HCL 4 MG/2ML IJ SOLN
INTRAMUSCULAR | Status: AC
  Filled 2024-05-26: qty 2

## 2024-05-26 MED ORDER — OXYCODONE HCL 5 MG/5ML PO SOLN
5.0000 mg | Freq: Once | ORAL | Status: AC | PRN
  Administered 2024-05-26: 5 mg
  Filled 2024-05-26: qty 5

## 2024-05-26 MED ORDER — DEXAMETHASONE SOD PHOSPHATE PF 10 MG/ML IJ SOLN
INTRAMUSCULAR | Status: AC
  Filled 2024-05-26: qty 1

## 2024-05-26 MED ORDER — KETOROLAC TROMETHAMINE 30 MG/ML IJ SOLN
INTRAMUSCULAR | Status: DC | PRN
  Administered 2024-05-26: 30 mg via INTRAVENOUS

## 2024-05-26 MED ORDER — ONDANSETRON HCL 4 MG/2ML IJ SOLN
INTRAMUSCULAR | Status: DC | PRN
  Administered 2024-05-26: 4 mg via INTRAVENOUS

## 2024-05-26 MED ORDER — SUGAMMADEX SODIUM 200 MG/2ML IV SOLN
INTRAVENOUS | Status: DC | PRN
  Administered 2024-05-26: 400 mg via INTRAVENOUS

## 2024-05-26 NOTE — Progress Notes (Signed)
 Abdominal binder placed.

## 2024-05-26 NOTE — Interval H&P Note (Signed)
 History and Physical Interval Note:  05/26/2024 7:18 AM  Darlene Christian  has presented today for surgery, with the diagnosis of HERNIA, VENTRAL, INCACERATED 3- 10 CM.  The various methods of treatment have been discussed with the patient and family. After consideration of risks, benefits and other options for treatment, the patient has consented to  Procedures with comments: REPAIR, HERNIA, VENTRAL, ROBOT-ASSISTED (N/A) - W/ MESH, pt knows to arrive at 6:15 as a surgical intervention.  The patient's history has been reviewed, patient examined, no change in status, stable for surgery.  I have reviewed the patient's chart and labs.  Questions were answered to the patient's satisfaction.     Oneil Budge

## 2024-05-26 NOTE — Progress Notes (Signed)
 Nasal cannula placed to try to wean patient off of oxygen.

## 2024-05-26 NOTE — Anesthesia Procedure Notes (Signed)
 Procedure Name: Intubation Date/Time: 05/26/2024 7:45 AM  Performed by: Cordella Elvie HERO, CRNAPre-anesthesia Checklist: Patient identified, Emergency Drugs available, Suction available, Patient being monitored and Timeout performed Patient Re-evaluated:Patient Re-evaluated prior to induction Oxygen Delivery Method: Circle system utilized Preoxygenation: Pre-oxygenation with 100% oxygen Induction Type: IV induction Ventilation: Mask ventilation without difficulty Laryngoscope Size: Mac and 3 Grade View: Grade II Tube type: Oral Tube size: 7.0 mm Number of attempts: 1 Airway Equipment and Method: Stylet Placement Confirmation: ETT inserted through vocal cords under direct vision, positive ETCO2, CO2 detector and breath sounds checked- equal and bilateral Secured at: 22 cm Tube secured with: Tape Dental Injury: Teeth and Oropharynx as per pre-operative assessment

## 2024-05-26 NOTE — Anesthesia Postprocedure Evaluation (Signed)
"   Anesthesia Post Note  Patient: Darlene Christian  Procedure(s) Performed: REPAIR, HERNIA, VENTRAL, ROBOT-ASSISTED (Abdomen)  Patient location during evaluation: Phase II Anesthesia Type: General Level of consciousness: awake Pain management: pain level controlled Vital Signs Assessment: post-procedure vital signs reviewed and stable Respiratory status: spontaneous breathing and respiratory function stable Cardiovascular status: blood pressure returned to baseline and stable Postop Assessment: no headache and no apparent nausea or vomiting Anesthetic complications: no Comments: Late entry   No notable events documented.   Last Vitals:  Vitals:   05/26/24 1035 05/26/24 1100  BP: 120/75   Pulse: 71   Resp: 17   Temp:    SpO2: 93% 92%    Last Pain:  Vitals:   05/26/24 1100  TempSrc:   PainSc: 5                  Yvonna PARAS [name]      "

## 2024-05-26 NOTE — Op Note (Signed)
 Patient:  Darlene Christian  DOB:  Jul 15, 1970  MRN:  [id number]   Preop Diagnosis: Incarcerated incisional hernia  Postop Diagnosis: Same  Procedure: Robotic assisted laparoscopic incisional herniorrhaphy with mesh  Surgeon: Oneil Budge, MD  Anes: General Endotracheal  Indications: Patient is a 55 year old white female who presents with an incarcerated ventral hernia.  The risks and benefits of the procedure including bleeding, infection, mesh use, and the possibility of an open procedure were fully explained to the patient, who gave informed consent.  Procedure note: The patient was placed in the supine position.  After induction of general endotracheal anesthesia, the abdomen was prepped and draped using the usual sterile technique with ChloraPrep.  Surgical site confirmation was performed.  An incision was made in the left upper quadrant at Palmer's point.  A Veress needle was introduced into the abdominal cavity and confirmation of placement was done using the saline drop test.  The abdomen was then insufflated to 15 mmHg pressure.  An 8 mm trocar was introduced into the abdominal cavity under direct visualization without difficulty.  Additional 8 mm trocars were placed in the left flank and left lower quadrant regions.  The robot was then docked and targeted.  The patient had omentum as well as part of the falciform ligament adipose tissue incarcerated in a 3 cm supraumbilical hernia.  This was reduced and the hernia sac as well as adipose tissue was excised and removed from the operative field.  On further exploration, the patient had a small umbilical hernia approximately 2 cm inferior to the previous hernia.  The hernia sac was excised.  The total defect measured approximately 6 cm in its greatest diameter.  This was closed longitudinally using an 0 STRATAFIX running suture.  An 11 cm Ventralight dual mesh was then inserted and secured circumferentially using 3-0 strata fix running sutures.   The area was inspected and no injury to the bowel was noted.  The robot was undocked and all air was evacuated from the abdominal cavity prior to the removal of the trocars.  All wounds were irrigated with normal saline.  All wounds were injected with 0.5% Sensorcaine .  All incisions were closed using a 4-0 Monocryl subcuticular suture.  Dermabond was applied.  All tape and needle counts were correct at the end of the procedure.  The patient was extubated in the operating room and transferred to PACU in stable condition.  Complications: None  EBL: Minimal  Specimen: None

## 2024-05-26 NOTE — Transfer of Care (Signed)
 Immediate Anesthesia Transfer of Care Note  Patient: Darlene Christian  Procedure(s) Performed: REPAIR, HERNIA, VENTRAL, ROBOT-ASSISTED (Abdomen)  Patient Location: PACU  Anesthesia Type:General  Level of Consciousness: awake, drowsy, and patient cooperative  Airway & Oxygen Therapy: Patient Spontanous Breathing and Patient connected to face mask oxygen  Post-op Assessment: Report given to RN, Post -op Vital signs reviewed and stable, and Patient moving all extremities X 4  Post vital signs: Reviewed and stable  Last Vitals:  Vitals Value Taken Time  BP 142/69 05/26/24 09:09  Temp 36.7 C 05/26/24 09:10  Pulse 63 05/26/24 09:12  Resp 16 05/26/24 09:12  SpO2 94 % 05/26/24 09:12  Vitals shown include unfiled device data.  Last Pain:  Vitals:   05/26/24 0659  TempSrc: Oral  PainSc: 0-No pain         Complications: No notable events documented.

## 2024-05-27 ENCOUNTER — Encounter (HOSPITAL_COMMUNITY): Payer: Self-pay | Admitting: General Surgery

## 2024-06-02 ENCOUNTER — Ambulatory Visit: Admitting: General Surgery
